# Patient Record
Sex: Female | Born: 1965 | Race: Black or African American | Hispanic: No | Marital: Single | Smoking: Never smoker
Health system: Southern US, Community
[De-identification: ages and names within clinical notes are randomized; demographics above are authoritative.]

## PROBLEM LIST (undated history)

## (undated) DIAGNOSIS — I10 Essential (primary) hypertension: Secondary | ICD-10-CM

---

## 2004-04-16 ENCOUNTER — Ambulatory Visit (HOSPITAL_COMMUNITY): Admission: AD | Admit: 2004-04-16 | Discharge: 2004-04-16 | Payer: Self-pay | Admitting: Obstetrics and Gynecology

## 2004-04-19 ENCOUNTER — Ambulatory Visit (HOSPITAL_COMMUNITY): Admission: AD | Admit: 2004-04-19 | Discharge: 2004-04-19 | Payer: Self-pay | Admitting: Obstetrics and Gynecology

## 2004-04-23 ENCOUNTER — Ambulatory Visit (HOSPITAL_COMMUNITY): Admission: AD | Admit: 2004-04-23 | Discharge: 2004-04-23 | Payer: Self-pay | Admitting: Obstetrics and Gynecology

## 2004-04-26 ENCOUNTER — Ambulatory Visit (HOSPITAL_COMMUNITY): Admission: AD | Admit: 2004-04-26 | Discharge: 2004-04-26 | Payer: Self-pay | Admitting: Obstetrics and Gynecology

## 2004-04-27 ENCOUNTER — Ambulatory Visit (HOSPITAL_COMMUNITY): Admission: RE | Admit: 2004-04-27 | Discharge: 2004-04-27 | Payer: Self-pay | Admitting: Obstetrics & Gynecology

## 2004-04-28 ENCOUNTER — Inpatient Hospital Stay (HOSPITAL_COMMUNITY): Admission: RE | Admit: 2004-04-28 | Discharge: 2004-05-02 | Payer: Self-pay | Admitting: *Deleted

## 2012-04-30 ENCOUNTER — Emergency Department (HOSPITAL_COMMUNITY)
Admission: EM | Admit: 2012-04-30 | Discharge: 2012-04-30 | Disposition: A | Discharge status: Home / Self Care | Payer: Medicaid Other | Attending: Emergency Medicine | Admitting: Emergency Medicine

## 2012-04-30 ENCOUNTER — Encounter (HOSPITAL_COMMUNITY): Payer: Self-pay

## 2012-04-30 DIAGNOSIS — I10 Essential (primary) hypertension: Secondary | ICD-10-CM | POA: Insufficient documentation

## 2012-04-30 DIAGNOSIS — E119 Type 2 diabetes mellitus without complications: Secondary | ICD-10-CM | POA: Insufficient documentation

## 2012-04-30 DIAGNOSIS — K0889 Other specified disorders of teeth and supporting structures: Secondary | ICD-10-CM

## 2012-04-30 DIAGNOSIS — K029 Dental caries, unspecified: Secondary | ICD-10-CM | POA: Insufficient documentation

## 2012-04-30 HISTORY — DX: Essential (primary) hypertension: I10

## 2012-04-30 MED ORDER — HYDROCODONE-ACETAMINOPHEN 5-325 MG PO TABS: 1.0000 | ORAL_TABLET | Freq: Four times a day (QID) | ORAL | Status: AC | PRN

## 2012-04-30 MED ORDER — PENICILLIN V POTASSIUM 500 MG PO TABS: 500.0000 mg | ORAL_TABLET | Freq: Four times a day (QID) | ORAL | Status: AC

## 2012-04-30 NOTE — ED Provider Notes (Signed)
History     CSN: [id number]  Arrival date & time 04/30/12  0811   First MD Initiated Contact with Patient 04/30/12 0820      Chief Complaint  Patient presents with  . Dental Pain    (Consider location/radiation/quality/duration/timing/severity/associated sxs/prior treatment) HPI Comments: Pain ~ 2 weeks.  Plans to see a dentist in [place] "as soon as i get the money together".  Using ambesol and ibuprofen with temporary relief.  Patient is a 46 y.o. female presenting with tooth pain. The history is provided by the patient. No language interpreter was used.  Dental PainThe primary symptoms include mouth pain. Primary symptoms do not include dental injury, oral bleeding, oral lesions or fever. The symptoms are worsening. The symptoms are new. The symptoms occur constantly.  Additional symptoms include: dental sensitivity to temperature. Additional symptoms do not include: purulent gums, trismus, jaw pain, facial swelling and swollen glands. Medical issues do not include: immunosuppression.    Past Medical History  Diagnosis Date  . Diabetes mellitus   . Hypertension     Past Surgical History  Procedure Date  . Cesarean section     No family history on file.  History  Substance Use Topics  . Smoking status: Never Smoker   . Smokeless tobacco: Not on file  . Alcohol Use: No    OB History    Grav Para Term Preterm Abortions TAB SAB Ect Mult Living                  Review of Systems  Constitutional: Negative for fever.  HENT: Positive for dental problem. Negative for facial swelling.     Allergies  Review of patient's allergies indicates no known allergies.  Home Medications   Current Outpatient Rx  Name Route Sig Dispense Refill  . HYDROCODONE-ACETAMINOPHEN 5-325 MG PO TABS Oral Take 1 tablet by mouth every 6 (six) hours as needed for pain. 20 tablet 0  . PENICILLIN V POTASSIUM 500 MG PO TABS Oral Take 1 tablet (500 mg total) by mouth 4 (four) times  daily. 40 tablet 0    BP 155/94  Pulse 83  Temp(Src) 98.5 F (36.9 C) (Oral)  Resp 18  Ht 5\' 6"  (1.676 m)  Wt 317 lb (143.79 kg)  BMI 51.17 kg/m2  SpO2 97%  LMP 04/11/2012  Physical Exam  Nursing note and vitals reviewed. Constitutional: She is oriented to person, place, and time. She appears well-developed and well-nourished. No distress.  HENT:  Head: Normocephalic and atraumatic. No trismus in the jaw.  Mouth/Throat: Uvula is midline, oropharynx is clear and moist and mucous membranes are normal. Dental caries present. No dental abscesses or uvula swelling.    Eyes: EOM are normal.  Neck: Normal range of motion.  Cardiovascular: Normal rate, regular rhythm and normal heart sounds.   Pulmonary/Chest: Effort normal and breath sounds normal.  Abdominal: Soft. She exhibits no distension. There is no tenderness.  Musculoskeletal: Normal range of motion.  Neurological: She is alert and oriented to person, place, and time.  Skin: Skin is warm and dry.  Psychiatric: She has a normal mood and affect. Judgment normal.    ED Course  Procedures (including critical care time)  Labs Reviewed - No data to display No results found.   1. Pain, dental       MDM  No obvious abscess. rx-hydrocodone, 20 rx-pen VK 500 mg QID, 40 Ibuprofen up to 800 mg TID with food.  Worthy Rancher, PA 04/30/12 [phone]

## 2012-04-30 NOTE — ED Notes (Signed)
Pt c/o toothache x 2 weeks.   

## 2012-04-30 NOTE — Discharge Instructions (Signed)
Dental Pain A tooth ache may be caused by cavities (tooth decay). Cavities expose the nerve of the tooth to air and hot or cold temperatures. It may come from an infection or abscess (also called a boil or furuncle) around your tooth. It is also often caused by dental caries (tooth decay). This causes the pain you are having. DIAGNOSIS  Your caregiver can diagnose this problem by exam. TREATMENT   If caused by an infection, it may be treated with medications which kill germs (antibiotics) and pain medications as prescribed by your caregiver. Take medications as directed.   Only take over-the-counter or prescription medicines for pain, discomfort, or fever as directed by your caregiver.   Whether the tooth ache today is caused by infection or dental disease, you should see your dentist as soon as possible for further care.  SEEK MEDICAL CARE IF: The exam and treatment you received today has been provided on an emergency basis only. This is not a substitute for complete medical or dental care. If your problem worsens or new problems (symptoms) appear, and you are unable to meet with your dentist, call or return to this location. SEEK IMMEDIATE MEDICAL CARE IF:   You have a fever.   You develop redness and swelling of your face, jaw, or neck.   You are unable to open your mouth.   You have severe pain uncontrolled by pain medicine.  MAKE SURE YOU:   Understand these instructions.   Will watch your condition.   Will get help right away if you are not doing well or get worse.  Document Released: 11/11/2005 Document Revised: 10/31/2011 Document Reviewed: 06/29/2008 [employer] Patient Information 2012 [employer].   Take the meds as directed.  Take ibuprofen 800 mg every 8 hrs with food.  Follow up with the dentist of your choice 

## 2012-04-30 NOTE — ED Provider Notes (Signed)
Medical screening examination/treatment/procedure(s) were performed by non-physician practitioner and as supervising physician I was immediately available for consultation/collaboration.  Yorley Buch, MD 04/30/12 1533 

## 2013-05-05 ENCOUNTER — Encounter (HOSPITAL_COMMUNITY): Payer: Self-pay | Admitting: Emergency Medicine

## 2013-05-05 ENCOUNTER — Emergency Department (HOSPITAL_COMMUNITY)
Admission: EM | Admit: 2013-05-05 | Discharge: 2013-05-05 | Disposition: A | Discharge status: Home / Self Care | Payer: Self-pay | Attending: Emergency Medicine | Admitting: Emergency Medicine

## 2013-05-05 DIAGNOSIS — J3489 Other specified disorders of nose and nasal sinuses: Secondary | ICD-10-CM | POA: Insufficient documentation

## 2013-05-05 DIAGNOSIS — I10 Essential (primary) hypertension: Secondary | ICD-10-CM | POA: Insufficient documentation

## 2013-05-05 DIAGNOSIS — E119 Type 2 diabetes mellitus without complications: Secondary | ICD-10-CM | POA: Insufficient documentation

## 2013-05-05 DIAGNOSIS — J329 Chronic sinusitis, unspecified: Secondary | ICD-10-CM | POA: Insufficient documentation

## 2013-05-05 MED ORDER — PREDNISONE 20 MG PO TABS
40.0000 mg | ORAL_TABLET | Freq: Once | ORAL | Status: AC
  Administered 2013-05-05: 40 mg via ORAL
  Filled 2013-05-05: qty 2

## 2013-05-05 MED ORDER — PREDNISONE 10 MG PO TABS: ORAL_TABLET | ORAL | Status: DC

## 2013-05-05 MED ORDER — HYDROCOD POLST-CHLORPHEN POLST 10-8 MG/5ML PO LQCR: 5.0000 mL | Freq: Two times a day (BID) | ORAL | Status: DC | PRN

## 2013-05-05 NOTE — ED Notes (Signed)
Pt c/o productive cough/congestion x 2 weeks. Pt states she has taken antibiotics and tessalon pearls with no relief. Nad noted.

## 2013-05-05 NOTE — ED Provider Notes (Signed)
History     CSN: [id number]  Arrival date & time 05/05/13  0803   First MD Initiated Contact with Patient 05/05/13 [phone]      Chief Complaint  Patient presents with  . Cough  . Nasal Congestion    (Consider location/radiation/quality/duration/timing/severity/associated sxs/prior treatment) HPI Comments: Kristen Briggs is a 47 y.o. female who presents to the Emergency Department complaining of nasal congestion and  Cough for 2 weeks.  States the cough is occasionally productive but mostly clear to white sputum.  She reports nasal congestion and sinus pressure. She denies shortness of breath, wheezing, fever, chest pain or post tussive emesis. She states that she was seen by her primary doctor at the onset of her symptoms  and given Tessalon Perles and a Z-Pak which did nothing to improve her symptoms. She states that she has an appointment with her primary care physician in [place] in one week.  Patient is a 47 y.o. female presenting with cough.  Cough Associated symptoms: rhinorrhea   Associated symptoms: no chest pain, no chills, no fever, no headaches, no shortness of breath, no sore throat and no wheezing     Past Medical History  Diagnosis Date  . Diabetes mellitus   . Hypertension     Past Surgical History  Procedure Laterality Date  . Cesarean section      No family history on file.  History  Substance Use Topics  . Smoking status: Never Smoker   . Smokeless tobacco: Not on file  . Alcohol Use: No    OB History   Grav Para Term Preterm Abortions TAB SAB Ect Mult Living                  Review of Systems  Constitutional: Negative for fever, chills, activity change and appetite change.  HENT: Positive for congestion, rhinorrhea and sinus pressure. Negative for sore throat and trouble swallowing.   Respiratory: Positive for cough. Negative for chest tightness, shortness of breath and wheezing.   Cardiovascular: Negative for chest pain.  Gastrointestinal:  Negative for nausea, vomiting and abdominal pain.  Neurological: Negative for dizziness, speech difficulty, weakness, numbness and headaches.  Hematological: Negative for adenopathy.  All other systems reviewed and are negative.    Allergies  Review of patient's allergies indicates no known allergies.  Home Medications  No current outpatient prescriptions on file.  BP 142/70  Pulse 75  Temp(Src) 98.5 F (36.9 C) (Oral)  Resp 18  Ht 5\' 5"  (1.651 m)  Wt 315 lb (142.883 kg)  BMI 52.42 kg/m2  SpO2 100%  LMP 04/19/2013  Physical Exam  Nursing note and vitals reviewed. Constitutional: She is oriented to person, place, and time. She appears well-developed and well-nourished. No distress.  HENT:  Head: Normocephalic and atraumatic.  Right Ear: Tympanic membrane and ear canal normal.  Left Ear: Tympanic membrane and ear canal normal.  Nose: Mucosal edema and rhinorrhea present. Right sinus exhibits maxillary sinus tenderness and frontal sinus tenderness. Left sinus exhibits maxillary sinus tenderness and frontal sinus tenderness.  Mouth/Throat: Uvula is midline, oropharynx is clear and moist and mucous membranes are normal. No oropharyngeal exudate.  Eyes: EOM are normal. Pupils are equal, round, and reactive to light.  Neck: Normal range of motion. Neck supple.  Cardiovascular: Normal rate, regular rhythm, normal heart sounds and intact distal pulses.   No murmur heard. Pulmonary/Chest: Effort normal. No respiratory distress. She has no wheezes. She has no rales. She exhibits no tenderness.  Lungs are  clear to auscultation bilaterally no wheezing, rales or stridor.  Musculoskeletal: She exhibits no edema.  Lymphadenopathy:    She has no cervical adenopathy.  Neurological: She is alert and oriented to person, place, and time. She exhibits normal muscle tone. Coordination normal.  Skin: Skin is warm and dry.    ED Course  Procedures (including critical care time)  Labs Reviewed  - No data to display No results found.      MDM    Patient is nontoxic appearing. Vital signs are stable. No hypoxia tachycardia or tachypnea. Cough with nasal congestion and sinus pressure for 2 weeks. No improvement with antibiotics. Symptoms are likely viral. Will try a prednisone taper. Patient understands that since she is a diabetic have advised her to discontinue the prednisone if her sugars become elevated. Patient agrees to the care plan, has an appointment for a followup visit with her primary doctor in one week. She appears stable for discharge.       Warrick Llera L. Trisha Mangle, PA-C 05/05/13 [phone]

## 2013-05-05 NOTE — ED Provider Notes (Signed)
Medical screening examination/treatment/procedure(s) were performed by non-physician practitioner and as supervising physician I was immediately available for consultation/collaboration.   Contessa Preuss L Francelia Mclaren, MD 05/05/13 1425 

## 2013-08-09 ENCOUNTER — Emergency Department (HOSPITAL_COMMUNITY)
Admission: EM | Admit: 2013-08-09 | Discharge: 2013-08-09 | Disposition: A | Discharge status: Home / Self Care | Payer: Self-pay | Attending: Emergency Medicine | Admitting: Emergency Medicine

## 2013-08-09 ENCOUNTER — Encounter (HOSPITAL_COMMUNITY): Payer: Self-pay

## 2013-08-09 ENCOUNTER — Emergency Department (HOSPITAL_COMMUNITY): Payer: Self-pay

## 2013-08-09 DIAGNOSIS — E119 Type 2 diabetes mellitus without complications: Secondary | ICD-10-CM | POA: Insufficient documentation

## 2013-08-09 DIAGNOSIS — I10 Essential (primary) hypertension: Secondary | ICD-10-CM | POA: Insufficient documentation

## 2013-08-09 DIAGNOSIS — Y939 Activity, unspecified: Secondary | ICD-10-CM | POA: Insufficient documentation

## 2013-08-09 DIAGNOSIS — X58XXXA Exposure to other specified factors, initial encounter: Secondary | ICD-10-CM | POA: Insufficient documentation

## 2013-08-09 DIAGNOSIS — IMO0002 Reserved for concepts with insufficient information to code with codable children: Secondary | ICD-10-CM | POA: Insufficient documentation

## 2013-08-09 DIAGNOSIS — Y929 Unspecified place or not applicable: Secondary | ICD-10-CM | POA: Insufficient documentation

## 2013-08-09 DIAGNOSIS — S56912A Strain of unspecified muscles, fascia and tendons at forearm level, left arm, initial encounter: Secondary | ICD-10-CM

## 2013-08-09 MED ORDER — HYDROCODONE-ACETAMINOPHEN 5-325 MG PO TABS
1.0000 | ORAL_TABLET | Freq: Once | ORAL | Status: AC
  Administered 2013-08-09: 1 via ORAL
  Filled 2013-08-09: qty 1

## 2013-08-09 MED ORDER — IBUPROFEN 800 MG PO TABS: 800.0000 mg | ORAL_TABLET | Freq: Three times a day (TID) | ORAL | Status: DC

## 2013-08-09 NOTE — ED Notes (Signed)
Pt alert & oriented x4, stable gait. Patient given discharge instructions, paperwork & prescription(s). Patient  instructed to stop at the registration desk to finish any additional paperwork. Patient verbalized understanding. Pt left department w/ no further questions. 

## 2013-08-09 NOTE — ED Notes (Signed)
Pt c/o pain to left arm x 2 or 3 days.  Pt says unable to straighten her arm out due to pain.  Pt says most of the pain is in the [facility] area but feels a little bit of pain in left shoulder.  Denies injury.  Reports pain is worse with movement.  Radial pulse present, pt can move fingers.

## 2013-08-09 NOTE — ED Provider Notes (Signed)
47 year old female with a history of left forearm pain. This has been present for approximately 4 days, persistent, it is difficult to straighten the arm and has difficulty with pronation and supination. She has no redness, no significant swelling and no fevers. This area is tender to palpation on my exam, she is able to extend her arm to near straight but has significant tenderness with that. There is also mild tenderness over the site of the proximal volar and radial forearm, no redness, no swelling, no changes in the skin, no streaking, no lymphadenopathy of the epitrochlear area, normal range of motion of the wrist and shoulder on the left. This does not appear to be a necrotizing fasciitis on exam, she is very well-appearing, I suspect that she has some tendinitis in the forearm, she does type at work. She will be instructed to use ice packs, elevation, anti-inflammatories and followup. Return precautions given for possible early infection though I doubt this at this time. Her joint is supple and there is no tenderness over the left elbow joint laterally. There is no other joint arthropathies.  Medical screening examination/treatment/procedure(s) were conducted as a shared visit with non-physician practitioner(s) and myself.  I personally evaluated the patient during the encounter.    Vida Roller, MD 08/09/13 [phone]

## 2013-08-09 NOTE — ED Provider Notes (Signed)
CSN: [id number]     Arrival date & time 08/09/13  0809 History   First MD Initiated Contact with Patient 08/09/13 0820     Chief Complaint  Patient presents with  . Arm Pain   (Consider location/radiation/quality/duration/timing/severity/associated sxs/prior Treatment) HPI Comments: Kristen Briggs is a 47 y.o. female who presents to the Emergency Department complaining of left forearm and elbow pain for 4 days. She states the pain is worse with rotation of her elbow and extension of her left arm. Pain improves when the arm is held near her side and slightly flexed. 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 She states the pain improves with rest and anti-inflammatories. She denies neck pain, chest pain, shortness of breath, shoulder pain, tingling to her fingertips or numbness. She does report having an IV in her left forearm approximately 3 weeks ago but denies any complications or redness or swelling to the arm.  Patient is a 47 y.o. female presenting with arm pain. The history is provided by the patient.  Arm Pain This is a new problem. The current episode started in the past 7 days. The problem occurs constantly. The problem has been unchanged. Associated symptoms include arthralgias and myalgias. Pertinent negatives include no chest pain, chills, congestion, diaphoresis, fever, headaches, joint swelling, nausea, neck pain, numbness, rash, sore throat, visual change, vomiting or weakness. The symptoms are aggravated by twisting (movement, extension). She has tried ice and NSAIDs for the symptoms. The treatment provided moderate relief.    Past Medical History  Diagnosis Date  . Diabetes mellitus   . Hypertension    Past Surgical History  Procedure Laterality Date  . Cesarean section     No family history on file. History  Substance Use Topics  . Smoking status: Never Smoker   . Smokeless tobacco: Not on file  . Alcohol Use: No   OB History   Grav Para Term Preterm Abortions TAB SAB Ect Mult Living                 Review of Systems  Constitutional: Negative for fever, chills and diaphoresis.  HENT: Negative for congestion, sore throat and neck pain.   Respiratory: Negative for chest tightness and shortness of breath.   Cardiovascular: Negative for chest pain.  Gastrointestinal: Negative for nausea and vomiting.  Genitourinary: Negative for dysuria and difficulty urinating.  Musculoskeletal: Positive for myalgias and arthralgias. Negative for joint swelling.  Skin: Negative for color change, rash and wound.  Neurological: Negative for dizziness, weakness, numbness and headaches.  All other systems reviewed and are negative.    Allergies  Review of patient's allergies indicates no known allergies.  Home Medications   Current Outpatient Rx  Name  Route  Sig  Dispense  Refill  . chlorpheniramine-HYDROcodone (TUSSIONEX PENNKINETIC ER) 10-8 MG/5ML LQCR   Oral   Take 5 mLs by mouth every 12 (twelve) hours as needed.   140 mL   0   . predniSONE (DELTASONE) 10 MG tablet      Take 6 tablets day one, 5 tablets day two, 4 tablets day three, 3 tablets day four, 2 tablets day five, then 1 tablet day six   21 tablet   0    BP 145/81  Pulse 64  Temp(Src) 98 F (36.7 C) (Oral)  Resp 18  Ht 5\' 6"  (1.676 m)  Wt 320 lb (145.151 kg)  BMI 51.67 kg/m2  SpO2 100%  LMP 07/24/2013 Physical  Exam  Nursing note and vitals reviewed. Constitutional: She is oriented to person, place, and time. She appears well-developed and well-nourished. No distress.  HENT:  Head: Normocephalic and atraumatic.  Neck: Normal range of motion. Neck supple. No thyromegaly present.  Cardiovascular: Normal rate, regular rhythm, normal heart sounds and intact distal pulses.   No murmur heard. Pulmonary/Chest: Effort normal and breath sounds normal. No respiratory distress. She exhibits no tenderness.  Musculoskeletal: She exhibits tenderness. She  exhibits no edema.   Ttp left proximal forearm and lateral elbow.   pain reproduced with extension of the arm and supination.  Radial pulse is brisk, distal sensation intact, CR< 2 sec. Grip strength is strong and symmetrical.   No erythema, edema , or bony deformity of the elbow joint. Site of previous IV is soft, NT, no erythema or excessive warmth. No tenderness of the vein.   Lymphadenopathy:    She has no cervical adenopathy.  Neurological: She is alert and oriented to person, place, and time. She has normal strength. No sensory deficit. She exhibits normal muscle tone. Coordination normal.  Skin: Skin is warm and dry.    ED Course  Procedures (including critical care time) Labs Review Labs Reviewed - No data to display Imaging Review No results found.  MDM    Patient has tenderness to the left proximal forearm muscle.  No tenderness of the elbow joint.  No concerning sx's for septic joint.  NV intact.    Patient also seen by EDP and care plan discussed.  Sx's likely related to muscular strain/tendontiits.    Pt agrees to ice, Ibuprofen for pain and to return here for any worsening symptoms or signs of infection.  Referral for Dr. Hilda Lias also given.  VSS.  Pt agrees to care plan and verbalized understanding for f/u  Coltan Spinello L. Trisha Mangle, PA-C 08/09/13 2118

## 2013-08-10 NOTE — ED Provider Notes (Signed)
Medical screening examination/treatment/procedure(s) were conducted as a shared visit with non-physician practitioner(s) and myself.  I personally evaluated the patient during the encounter  Please see my separate respective documentation pertaining to this patient encounter   Vida Roller, MD 08/10/13 [phone]

## 2014-04-06 ENCOUNTER — Emergency Department (HOSPITAL_COMMUNITY)
Admission: EM | Admit: 2014-04-06 | Discharge: 2014-04-06 | Disposition: A | Discharge status: Home / Self Care | Payer: Self-pay | Attending: Emergency Medicine | Admitting: Emergency Medicine

## 2014-04-06 ENCOUNTER — Emergency Department (HOSPITAL_COMMUNITY): Payer: Self-pay

## 2014-04-06 ENCOUNTER — Encounter (HOSPITAL_COMMUNITY): Payer: Self-pay | Admitting: Emergency Medicine

## 2014-04-06 DIAGNOSIS — Z791 Long term (current) use of non-steroidal anti-inflammatories (NSAID): Secondary | ICD-10-CM | POA: Insufficient documentation

## 2014-04-06 DIAGNOSIS — I10 Essential (primary) hypertension: Secondary | ICD-10-CM | POA: Insufficient documentation

## 2014-04-06 DIAGNOSIS — R109 Unspecified abdominal pain: Secondary | ICD-10-CM

## 2014-04-06 DIAGNOSIS — K529 Noninfective gastroenteritis and colitis, unspecified: Secondary | ICD-10-CM

## 2014-04-06 DIAGNOSIS — Z3202 Encounter for pregnancy test, result negative: Secondary | ICD-10-CM | POA: Insufficient documentation

## 2014-04-06 DIAGNOSIS — E119 Type 2 diabetes mellitus without complications: Secondary | ICD-10-CM | POA: Insufficient documentation

## 2014-04-06 DIAGNOSIS — K5289 Other specified noninfective gastroenteritis and colitis: Secondary | ICD-10-CM | POA: Insufficient documentation

## 2014-04-06 LAB — COMPREHENSIVE METABOLIC PANEL
ALK PHOS: 141 U/L — AB (ref 39–117)
ALT: 12 U/L (ref 0–35)
AST: 14 U/L (ref 0–37)
Albumin: 3 g/dL — ABNORMAL LOW (ref 3.5–5.2)
BUN: 10 mg/dL (ref 6–23)
CHLORIDE: 96 meq/L (ref 96–112)
CO2: 28 meq/L (ref 19–32)
Calcium: 8.9 mg/dL (ref 8.4–10.5)
Creatinine, Ser: 0.78 mg/dL (ref 0.50–1.10)
Glucose, Bld: 423 mg/dL — ABNORMAL HIGH (ref 70–99)
Potassium: 3.8 mEq/L (ref 3.7–5.3)
SODIUM: 134 meq/L — AB (ref 137–147)
Total Bilirubin: 0.3 mg/dL (ref 0.3–1.2)
Total Protein: 7.5 g/dL (ref 6.0–8.3)

## 2014-04-06 LAB — URINALYSIS, ROUTINE W REFLEX MICROSCOPIC
BILIRUBIN URINE: NEGATIVE
GLUCOSE, UA: 250 mg/dL — AB
HGB URINE DIPSTICK: NEGATIVE
Ketones, ur: NEGATIVE mg/dL
Leukocytes, UA: NEGATIVE
Nitrite: NEGATIVE
PH: 6 (ref 5.0–8.0)
PROTEIN: NEGATIVE mg/dL
SPECIFIC GRAVITY, URINE: 1.01 (ref 1.005–1.030)
UROBILINOGEN UA: 0.2 mg/dL (ref 0.0–1.0)

## 2014-04-06 LAB — CBC WITH DIFFERENTIAL/PLATELET
BASOS PCT: 0 % (ref 0–1)
Basophils Absolute: 0.1 10*3/uL (ref 0.0–0.1)
EOS ABS: 0.2 10*3/uL (ref 0.0–0.7)
EOS PCT: 2 % (ref 0–5)
HCT: 38.3 % (ref 36.0–46.0)
Hemoglobin: 13.5 g/dL (ref 12.0–15.0)
LYMPHS ABS: 4.1 10*3/uL — AB (ref 0.7–4.0)
LYMPHS PCT: 28 % (ref 12–46)
MCH: 28.1 pg (ref 26.0–34.0)
MCHC: 35.2 g/dL (ref 30.0–36.0)
MCV: 79.6 fL (ref 78.0–100.0)
MONO ABS: 0.8 10*3/uL (ref 0.1–1.0)
MONOS PCT: 5 % (ref 3–12)
NEUTROS ABS: 9.5 10*3/uL — AB (ref 1.7–7.7)
NEUTROS PCT: 65 % (ref 43–77)
PLATELETS: 367 10*3/uL (ref 150–400)
RBC: 4.81 MIL/uL (ref 3.87–5.11)
RDW: 12.1 % (ref 11.5–15.5)
WBC: 14.6 10*3/uL — ABNORMAL HIGH (ref 4.0–10.5)

## 2014-04-06 LAB — LIPASE, BLOOD: Lipase: 39 U/L (ref 11–59)

## 2014-04-06 LAB — PREGNANCY, URINE: PREG TEST UR: NEGATIVE

## 2014-04-06 MED ORDER — CIPROFLOXACIN HCL 500 MG PO TABS: 500.0000 mg | ORAL_TABLET | Freq: Two times a day (BID) | ORAL | Status: DC

## 2014-04-06 MED ORDER — HYDROCODONE-ACETAMINOPHEN 5-325 MG PO TABS: ORAL_TABLET | ORAL | Status: DC

## 2014-04-06 MED ORDER — PROMETHAZINE HCL 25 MG PO TABS: 25.0000 mg | ORAL_TABLET | Freq: Four times a day (QID) | ORAL | Status: DC | PRN

## 2014-04-06 MED ORDER — METRONIDAZOLE 500 MG PO TABS: 500.0000 mg | ORAL_TABLET | Freq: Three times a day (TID) | ORAL | Status: DC

## 2014-04-06 MED ORDER — IOHEXOL 300 MG/ML  SOLN
50.0000 mL | Freq: Once | INTRAMUSCULAR | Status: AC | PRN
  Administered 2014-04-06: 50 mL via ORAL

## 2014-04-06 MED ORDER — IOHEXOL 300 MG/ML  SOLN
100.0000 mL | Freq: Once | INTRAMUSCULAR | Status: AC | PRN
  Administered 2014-04-06: 100 mL via INTRAVENOUS

## 2014-04-06 NOTE — ED Provider Notes (Signed)
CSN: [id number]     Arrival date & time 04/06/14  1134 History   First MD Initiated Contact with Patient 04/06/14 1155     Chief Complaint  Patient presents with  . Abdominal Pain     HPI Pt was seen at 1155.  Per pt, c/o gradual onset and persistence of constant generalized abd "pain" since yesterday.  Has been associated with nausea.  Describes the abd pain as "sore," with radiation into her left lower back. Denies vomiting/diarrhea, no fevers, no dysuria/hematuria, no vaginal bleeding/discharge, no rash, no CP/SOB, no black or blood in stools.       Past Medical History  Diagnosis Date  . Diabetes mellitus   . Hypertension    Past Surgical History  Procedure Laterality Date  . Cesarean section      History  Substance Use Topics  . Smoking status: Never Smoker   . Smokeless tobacco: Not on file  . Alcohol Use: No    Review of Systems ROS: Statement: All systems negative except as marked or noted in the HPI; Constitutional: Negative for fever and chills. ; ; Eyes: Negative for eye pain, redness and discharge. ; ; ENMT: Negative for ear pain, hoarseness, nasal congestion, sinus pressure and sore throat. ; ; Cardiovascular: Negative for chest pain, palpitations, diaphoresis, dyspnea and peripheral edema. ; ; Respiratory: Negative for cough, wheezing and stridor. ; ; Gastrointestinal: +nausea, abd pain. Negative for vomiting, diarrhea, blood in stool, hematemesis, jaundice and rectal bleeding. . ; ; Genitourinary: Negative for dysuria, flank pain and hematuria. ; ; GYN:  No vaginal bleeding, no vaginal discharge, no vulvar pain.;;  Musculoskeletal: +low back pain. Negative for neck pain. Negative for swelling and trauma.; ; Skin: Negative for pruritus, rash, abrasions, blisters, bruising and skin lesion.; ; Neuro: Negative for headache, lightheadedness and neck stiffness. Negative for weakness, altered level of consciousness , altered mental status, extremity weakness, paresthesias,  involuntary movement, seizure and syncope.        Allergies  Review of patient's allergies indicates no known allergies.  Home Medications   Prior to Admission medications   Medication Sig Start Date End Date Taking? Authorizing Provider  chlorpheniramine-HYDROcodone (TUSSIONEX PENNKINETIC ER) 10-8 MG/5ML LQCR Take 5 mLs by mouth every 12 (twelve) hours as needed. 05/05/13   Tammy L. Triplett, PA-C  ibuprofen (ADVIL,MOTRIN) 800 MG tablet Take 1 tablet (800 mg total) by mouth 3 (three) times daily. Take with food 08/09/13   Tammy L. Triplett, PA-C  predniSONE (DELTASONE) 10 MG tablet Take 6 tablets day one, 5 tablets day two, 4 tablets day three, 3 tablets day four, 2 tablets day five, then 1 tablet day six 05/05/13   Tammy L. Triplett, PA-C   BP 132/85  Pulse 81  Temp(Src) 98.7 F (37.1 C) (Oral)  Resp 16  Ht 5\' 5"  (1.651 m)  Wt 316 lb (143.337 kg)  BMI 52.59 kg/m2  SpO2 99%  LMP 03/30/2014 Physical Exam 1200: Physical examination:  Nursing notes reviewed; Vital signs and O2 SAT reviewed;  Constitutional: Well developed, Well nourished, Well hydrated, In no acute distress; Head:  Normocephalic, atraumatic; Eyes: EOMI, PERRL, No scleral icterus; ENMT: Mouth and pharynx normal, Mucous membranes moist; Neck: Supple, Full range of motion, No lymphadenopathy; Cardiovascular: Regular rate and rhythm, No murmur, rub, or gallop; Respiratory: Breath sounds clear & equal bilaterally, No rales, rhonchi, wheezes.  Speaking full sentences with ease, Normal respiratory effort/excursion; Chest: Nontender, Movement normal; Abdomen: Soft, +minimal diffuse tenderness to palp. No rebound or  guarding. Nondistended, Normal bowel sounds; Genitourinary: No CVA tenderness; Spine:  No midline CS, TS, LS tenderness. No rash.;; Extremities: Pulses normal, No tenderness, No edema, No calf edema or asymmetry.; Neuro: AA&Ox3, Major CN grossly intact.  Speech clear. No gross focal motor or sensory deficits in  extremities. Climbs on and off stretcher easily by herself. Gait steady.; Skin: Color normal, Warm, Dry.    ED Course  Procedures     EKG Interpretation None      MDM  MDM Reviewed: previous chart, nursing note and vitals Reviewed previous: labs Interpretation: labs and CT scan    Results for orders placed during the hospital encounter of 04/06/14  CBC WITH DIFFERENTIAL      Result Value Ref Range   WBC 14.6 (*) 4.0 - 10.5 K/uL   RBC 4.81  3.87 - 5.11 MIL/uL   Hemoglobin 13.5  12.0 - 15.0 g/dL   HCT 16.1  09.6 - 04.5 %   MCV 79.6  78.0 - 100.0 fL   MCH 28.1  26.0 - 34.0 pg   MCHC 35.2  30.0 - 36.0 g/dL   RDW 40.9  81.1 - 91.4 %   Platelets 367  150 - 400 K/uL   Neutrophils Relative % 65  43 - 77 %   Neutro Abs 9.5 (*) 1.7 - 7.7 K/uL   Lymphocytes Relative 28  12 - 46 %   Lymphs Abs 4.1 (*) 0.7 - 4.0 K/uL   Monocytes Relative 5  3 - 12 %   Monocytes Absolute 0.8  0.1 - 1.0 K/uL   Eosinophils Relative 2  0 - 5 %   Eosinophils Absolute 0.2  0.0 - 0.7 K/uL   Basophils Relative 0  0 - 1 %   Basophils Absolute 0.1  0.0 - 0.1 K/uL  COMPREHENSIVE METABOLIC PANEL      Result Value Ref Range   Sodium 134 (*) 137 - 147 mEq/L   Potassium 3.8  3.7 - 5.3 mEq/L   Chloride 96  96 - 112 mEq/L   CO2 28  19 - 32 mEq/L   Glucose, Bld 423 (*) 70 - 99 mg/dL   BUN 10  6 - 23 mg/dL   Creatinine, Ser 7.82  0.50 - 1.10 mg/dL   Calcium 8.9  8.4 - 95.6 mg/dL   Total Protein 7.5  6.0 - 8.3 g/dL   Albumin 3.0 (*) 3.5 - 5.2 g/dL   AST 14  0 - 37 U/L   ALT 12  0 - 35 U/L   Alkaline Phosphatase 141 (*) 39 - 117 U/L   Total Bilirubin 0.3  0.3 - 1.2 mg/dL   GFR calc non Af Amer >90  >90 mL/min   GFR calc Af Amer >90  >90 mL/min  LIPASE, BLOOD      Result Value Ref Range   Lipase 39  11 - 59 U/L  URINALYSIS, ROUTINE W REFLEX MICROSCOPIC      Result Value Ref Range   Color, Urine YELLOW  YELLOW   APPearance CLEAR  CLEAR   Specific Gravity, Urine 1.010  1.005 - 1.030   pH 6.0  5.0 -  8.0   Glucose, UA 250 (*) NEGATIVE mg/dL   Hgb urine dipstick NEGATIVE  NEGATIVE   Bilirubin Urine NEGATIVE  NEGATIVE   Ketones, ur NEGATIVE  NEGATIVE mg/dL   Protein, ur NEGATIVE  NEGATIVE mg/dL   Urobilinogen, UA 0.2  0.0 - 1.0 mg/dL   Nitrite NEGATIVE  NEGATIVE  Leukocytes, UA NEGATIVE  NEGATIVE  PREGNANCY, URINE      Result Value Ref Range   Preg Test, Ur NEGATIVE  NEGATIVE   Ct Abdomen Pelvis W Contrast 04/06/2014   CLINICAL DATA:  Abdominal pain, nausea  EXAM: CT ABDOMEN AND PELVIS WITH CONTRAST  TECHNIQUE: Multidetector CT imaging of the abdomen and pelvis was performed using the standard protocol following bolus administration of intravenous contrast.  CONTRAST:  50mL OMNIPAQUE IOHEXOL 300 MG/ML SOLN, 100mL OMNIPAQUE IOHEXOL 300 MG/ML SOLN  COMPARISON:  None.  FINDINGS: Lung bases are unremarkable. Sagittal images of the spine shows mild degenerative changes lower thoracic spine.  There is fatty infiltration of the liver. The gallbladder is contracted without evidence of calcified gallstones. No pericholecystic fluid.  The pancreas, spleen and right adrenal is unremarkable. There is a low density nodule left adrenal gland measures 1.9 cm and 22 Hounsfield units in attenuation. This probable represents another enema.  Abdominal aorta is unremarkable. No small bowel obstruction. No ascites or free air. No adenopathy.  There is no pericecal inflammation. The terminal ileum is unremarkable. The appendix is not clearly identified.  There is nonspecific minimal thickening of colonic wall in hepatic flexure of the colon axial image 40 and sagittal image 49. Clinical correlation is necessary to exclude minimal colitis. There is no surrounding mesenteric stranding or fluid.  No colonic obstruction.  There is some fluid noted within endocervical canal of the uterus. No adnexal masses noted. No inguinal adenopathy. No destructive bony lesions are noted within pelvis. The urinary bladder is unremarkable.   Kidneys are symmetrical in size and enhancement. No hydronephrosis or hydroureter.  Delayed renal images shows bilateral renal symmetrical excretion. Bilateral visualized proximal ureter is unremarkable.  IMPRESSION: 1. There is fatty infiltration of the liver.  No focal hepatic mass. 2. Contracted gallbladder without evidence of calcified gallstones. 3. Nonspecific minimal thickening of colonic wall in hepatic flexure of the colon. Minimal short segment colitis cannot be excluded. Clinical correlation is necessary. There is no surrounding fluid or inflammation. 4. No pericecal inflammation. The appendix is not clearly identified. 5. Small amount of fluid in endocervical canal of the uterus. No adnexal mass.   Electronically Signed   By: Natasha MeadLiviu  Pop M.D.   On: 04/06/2014 13:18    1405:  Pt has tol PO well while in the ED without N/V.  No stooling while in the ED.  Abd benign, VSS. Pt wants to go home now. Will tx for possible colitis. Dx and testing d/w pt.  Questions answered.  Verb understanding, agreeable to d/c home with outpt f/u.    Laray AngerKathleen M Panagiotis Oelkers, DO 04/08/14 2139

## 2014-04-06 NOTE — ED Notes (Signed)
Pt states generalized abdominal pain which began last night. Also states "a little nausea, I guess"

## 2014-04-06 NOTE — Discharge Instructions (Signed)
°Emergency Department Resource Guide °1) Find a Doctor and Pay Out of Pocket °Although you won't have to find out who is covered by your insurance plan, it is a good idea to ask around and get recommendations. You will then need to call the office and see if the doctor you have chosen will accept you as a new patient and what types of options they offer for patients who are self-pay. Some doctors offer discounts or will set up payment plans for their patients who do not have insurance, but you will need to ask so you aren't surprised when you get to your appointment. ° °2) Contact Your Local Health Department °Not all health departments have doctors that can see patients for sick visits, but many do, so it is worth a call to see if yours does. If you don't know where your local health department is, you can check in your phone book. The CDC also has a tool to help you locate your state's health department, and many state websites also have listings of all of their local health departments. ° °3) Find a Walk-in Clinic °If your illness is not likely to be very severe or complicated, you may want to try a walk in clinic. These are popping up all over the country in pharmacies, drugstores, and shopping centers. They're usually staffed by nurse practitioners or physician assistants that have been trained to treat common illnesses and complaints. They're usually fairly quick and inexpensive. However, if you have serious medical issues or chronic medical problems, these are probably not your best option. ° °No Primary Care Doctor: °- Call [facility] at  832-8000 - they can help you locate a primary care doctor that  accepts your insurance, provides certain services, etc. °- Physician Referral Service- 1-800-533-3463 ° °Chronic Pain Problems: °Organization         Address  Phone   Notes  °[facility]  (336) [phone] Patients need to be referred by their primary care doctor.  ° °Medication  Assistance: °Organization         Address  Phone   Notes  °[place] [facility] [address] °[place], [place] [address] (336) 641-8030 --Must be a resident of [place] °-- Must have NO insurance coverage whatsoever (no Medicaid/ Medicare, etc.) °-- The pt. MUST have a primary care doctor that directs their care regularly and follows them in the community °  °[facility]  (866) 331-1348   °[employer]  (888) 892-1162   ° °Agencies that provide inexpensive medical care: °Organization         Address  Phone   Notes  °[facility]  (336) 832-8035   ° Internal Medicine    (336) 832-7272   °[facility] [address] °[place], [place] [address] (336) 832-4777   °[facility] [address], °[place] (336) 271-4999   °[facility]    (336) 373-0678   °[facility]    (336) 272-1050   °[facility] ° [address], [place] Phone:  (336) 832-4444, Fax:  (336) 832-4440 Hours of Operation:  9 am - 6 pm, M-F.  Also accepts Medicaid/Medicare and self-pay.  °[facility] ° [address],  Phone: (336) 832-3150, Fax: (336) [phone]. Hours of Operation:  8:30 am - 5:30 pm, M-F.  Also accepts Medicaid and self-pay.  °[facility] [place] [address], [place] Phone: (336) 878-6027   °[facility] [address], [place], [place] (336)723-1848, Ext. 123 Mondays & Thursdays: 7-9 AM.  First 15 patients are seen on a first come, first serve basis. °  ° °Medicaid-accepting [place] Providers: ° °Organization         Address  Phone   Notes  °[facility] [address], [place] (336) 641-2100 Also accepts self-pay patients.  °[facility] [address], [place] ° (336) 856-9996   °[facility] [address], [place]  (336) 288-8857   °[facility] [address] [place] [address], [place] (336) 299-7000   °Veita Bland [address], [place]  ° (336) 373-1557 Only accepts [place] Access Medicaid patients after they have their name applied to their card.  ° °Self-Pay (no insurance) in [place]: ° °Organization         Address  Phone   Notes  °Sickle Cell Patients, [facility] [address], [place] (336) 832-1970   °[facility] [address], [place] (336) 832-4400   °[facility] ° [address], [place] (336) 992-4800   °[facility]/Dr. Osei-Bonsu ° [address] [place] [address], [place] or [address], [place] (336) [phone] Phone number for both [place] and [place] locations is the same.  °[facility] [address], [place] (336) 299-0000   °[facility] [address] [place] [address], [place] or [address] (336) 852-7530 °(336) 878-2260   °[facility] [address], [place] (336) 350-1642, phone; (336) 294-5005, fax Sees patients 1st and 3rd Saturday of every month.  Must not qualify for public or private insurance (i.e. Medicaid, Medicare, [place] Health Choice, Veterans' Benefits) • Household income should be no more than 200% of the poverty level •The clinic cannot treat you if you are pregnant or think you are pregnant • Sexually transmitted diseases are not treated at the clinic.  ° ° °Dental Care: °Organization         Address  Phone  Notes  °[place] [facility] [address], [place] (336) 641-6152 Accepts children up to age 21 who are enrolled in Medicaid or [place] Health Choice; pregnant women with a Medicaid card; and children who have applied for Medicaid or [place] Health Choice, but were declined, whose parents can pay a reduced fee at time of service.  °[place]  [facility] [place]  [address], [place] (336) 641-7733 Accepts children up to age 21 who are enrolled in Medicaid or [place] Health Choice; pregnant women with a Medicaid card; and children who have applied for Medicaid or  Health Choice, but were declined, whose parents can pay a reduced fee at time of service.  °[facility] ° [address], [place] (336) 641-4533 Patients are seen by appointment only. Walk-ins are not accepted. [facility] will see patients 18 years of age and older. °Monday - Tuesday (8am-5pm) °Most Wednesdays (8:30-5pm) °$30 per visit, cash only  °[facility] ° [address], [place] (336) 641-4533 Patients are seen by appointment only. Walk-ins are not accepted. [facility] will see patients 18 years of age and older. °One   Wednesday Evening (Monthly: Volunteer Based).  $30 per visit, cash only  °[facility]  (919) 537-3737 for adults; Children under age 4, call Graduate Pediatric Dentistry at (919) [phone]. Children aged 4-14, please call (919) [phone] to request a pediatric application. ° Dental services are provided in all areas of dental care including fillings, crowns and bridges, complete and partial dentures, implants, gum treatment, root canals, and extractions. Preventive care is also provided. Treatment is provided to both adults and children. °Patients are selected via a lottery and there is often a waiting list. °  °[facility] [address], °[place] ° (336) 763-8833 [email or web address] °  °[facility] [address], [place], [place] (336)723-1848, Ext. 123 Second and Fourth Thursday of each month, opens at 6:30 AM; Clinic ends at 9 AM.  Patients are seen on a first-come first-served basis, and a limited number are seen during each clinic.  ° °[facility] ° [address], [place], [place] (336) 723-7904    Eligibility Requirements °You must have lived in [place], [place], or [place] counties for at least the last three months. °  You cannot be eligible for state or federal sponsored healthcare insurance, including Veterans Administration, Medicaid, or Medicare. °  You generally cannot be eligible for healthcare insurance through your employer.  °  How to apply: °Eligibility screenings are held every Tuesday and Wednesday afternoon from 1:00 pm until 4:00 pm. You do not need an appointment for the interview!  °[facility] [address], [place], [place] 336-631-2330   °[facility]  336-342-8273   °[facility]  336-703-3100   °[facility]  336-570-6415   ° °Behavioral Health Resources in the Community: °Intensive Outpatient Programs °Organization         Address  Phone  Notes  °[facility] [address], [place], [place] 336-878-6098   °[facility] [address], [place], [place] 336-832-9800   °[facility] [address], [place], [place] ° 336-882-2125   °[facility] [address],  °[place], [place] 1-800-853-5163 or 336-641-4981   °Substance Abuse Resources °Organization         Address  Phone  Notes  °[facility]  336-882-2125   °[facility]  336-784-9470   °[facility]  336-285-9073   °[facility]  336-845-3988   °Residential & Outpatient Substance Abuse Program  1-800-659-3381   °Psychological Services °Organization         Address  Phone  Notes  °[facility]  336- 832-9600   °[facility]  336- 378-7881   °[facility] [address], [place] 1-800-853-5163 or 336-641-4981   ° °Mobile Crisis Teams °Organization         Address  Phone  Notes  °[facility], Mobile Crisis Care Unit  1-877-626-1772   °Assertive °[facility] ° [address]  [place], [place] 336-834-9664   °Sharon DeEsch [address] °[place] 336-554-5454   ° °Self-Help/Support Groups °Organization         Address  Phone             Notes  °[facility] - variety of support groups  336- 373-1402 Call for more information  °Narcotics Anonymous (NA), [facility] [address], °[place]   2 meetings at this location  ° °  Residential Treatment Programs °Organization         Address  Phone  Notes  °[facility] [address],    °[place]  1-866-801-8205   °[facility] ° [address], [place], [place] 704-293-8524   °[facility] [address], [place] 336-845-3988 Admissions: 8am-3pm M-F  °[facility] [address]    °[place], [place] 336-841-1104   °[facility] [address], [place], [place] 336-379-7146   °[facility] [address]  °[place], [place] 336-285-9073   °[facility] - Intensive Outpatient [address], [place], [place] 336-852-3033   °[facility] [address]  °[place], [place] 1-877-615-2722 or 336-784-9470   °[facility] [address] [place], [place] 336-227-7417 Accepts Medicaid  °[facility] [address]  °[place] 1-800-659-3381 Substance Abuse/Addiction Treatment  ° °[place] Behavioral Health Resources °Organization         Address  Phone  Notes  °[facility]  (888) 581-9988   °Julie Brannon, PhD [address] [place], [place]   (336) 349-5553 or (336) 951-0000   °[facility]   [address] °[place], [place] (336) 349-4454   °[facility] [address], [place], [place] (336) 342-8316 Insurance/Medicaid/sponsorship through [facility]  °[facility] [address]                                    [place], [place] (336) 342-8316 Therapy/tele-psych/case    °[facility] [address]  ° [place],  (336) 349-2233    °Dr. Arfeen  (336) 349-4544   °[facility] [place]  [employer] [place] [facility] 1) [address], [place] °2) [address], [place] °3)  [address], [place] (336) 349-3220 °(336) 342-7768 ° °(336) 342-8140   °[place] Child Abuse [facility] (336) 342-1394 or (336) 342-3537 (After Hours)    ° ° °Take the prescriptions as directed.  Call your regular medical doctor today to schedule a follow up appointment within the next 2 days.  Return to the Emergency Department immediately sooner if worsening.  ° °

## 2014-04-06 NOTE — ED Notes (Signed)
Pt received discharge instructions and prescriptions, verbalized understanding and has no further questions. Pt ambulated to exit in stable condition.   

## 2014-05-16 ENCOUNTER — Emergency Department (HOSPITAL_COMMUNITY): Payer: Self-pay

## 2014-05-16 ENCOUNTER — Encounter (HOSPITAL_COMMUNITY): Payer: Self-pay | Admitting: Emergency Medicine

## 2014-05-16 ENCOUNTER — Emergency Department (HOSPITAL_COMMUNITY)
Admission: EM | Admit: 2014-05-16 | Discharge: 2014-05-16 | Disposition: A | Discharge status: Home / Self Care | Payer: Self-pay | Attending: Emergency Medicine | Admitting: Emergency Medicine

## 2014-05-16 DIAGNOSIS — E119 Type 2 diabetes mellitus without complications: Secondary | ICD-10-CM | POA: Insufficient documentation

## 2014-05-16 DIAGNOSIS — J189 Pneumonia, unspecified organism: Secondary | ICD-10-CM

## 2014-05-16 DIAGNOSIS — Z792 Long term (current) use of antibiotics: Secondary | ICD-10-CM | POA: Insufficient documentation

## 2014-05-16 DIAGNOSIS — R609 Edema, unspecified: Secondary | ICD-10-CM | POA: Insufficient documentation

## 2014-05-16 DIAGNOSIS — J159 Unspecified bacterial pneumonia: Secondary | ICD-10-CM | POA: Insufficient documentation

## 2014-05-16 DIAGNOSIS — I10 Essential (primary) hypertension: Secondary | ICD-10-CM | POA: Insufficient documentation

## 2014-05-16 DIAGNOSIS — Z7982 Long term (current) use of aspirin: Secondary | ICD-10-CM | POA: Insufficient documentation

## 2014-05-16 DIAGNOSIS — Z79899 Other long term (current) drug therapy: Secondary | ICD-10-CM | POA: Insufficient documentation

## 2014-05-16 DIAGNOSIS — E669 Obesity, unspecified: Secondary | ICD-10-CM | POA: Insufficient documentation

## 2014-05-16 LAB — BASIC METABOLIC PANEL
BUN: 9 mg/dL (ref 6–23)
CALCIUM: 8.5 mg/dL (ref 8.4–10.5)
CHLORIDE: 103 meq/L (ref 96–112)
CO2: 25 mEq/L (ref 19–32)
Creatinine, Ser: 0.79 mg/dL (ref 0.50–1.10)
GFR calc non Af Amer: >90 mL/min (ref >90)
GLUCOSE: 276 mg/dL — AB (ref 70–99)
POTASSIUM: 4.2 meq/L (ref 3.7–5.3)
Sodium: 139 mEq/L (ref 137–147)

## 2014-05-16 LAB — TROPONIN I

## 2014-05-16 LAB — PRO B NATRIURETIC PEPTIDE: Pro B Natriuretic peptide (BNP): 1273 pg/mL — ABNORMAL HIGH (ref 0–125)

## 2014-05-16 LAB — CBC
HEMATOCRIT: 36.7 % (ref 36.0–46.0)
HEMOGLOBIN: 12.8 g/dL (ref 12.0–15.0)
MCH: 28.2 pg (ref 26.0–34.0)
MCHC: 34.9 g/dL (ref 30.0–36.0)
MCV: 80.8 fL (ref 78.0–100.0)
PLATELETS: 321 10*3/uL (ref 150–400)
RBC: 4.54 MIL/uL (ref 3.87–5.11)
RDW: 12.8 % (ref 11.5–15.5)
WBC: 16.9 10*3/uL — AB (ref 4.0–10.5)

## 2014-05-16 MED ORDER — FUROSEMIDE 20 MG PO TABS: 20.0000 mg | ORAL_TABLET | Freq: Every day | ORAL | Status: DC

## 2014-05-16 MED ORDER — DOXYCYCLINE HYCLATE 100 MG PO CAPS: 100.0000 mg | ORAL_CAPSULE | Freq: Two times a day (BID) | ORAL | Status: DC

## 2014-05-16 NOTE — ED Provider Notes (Signed)
CSN: [id number]     Arrival date & time 05/16/14  1055 History  This chart was scribed for American Expressathan R. Rubin PayorPickering, MD by Ardelia Memsylan Malpass, ED Scribe. This patient was seen in room APA12/APA12 and the patient's care was started at 12:30 PM.   Chief Complaint  Patient presents with  . Leg Swelling    The history is provided by the patient. No language interpreter was used.    HPI Comments: Kristen Briggs is a 48 y.o. Female with a history of DM and HTN who presents to the Emergency Department complaining of bilateral lower leg swelling over the past week. She reports associated "tightness" in the right side of her chest and SOB over the past 2 days. She denies any chest "pain". She states that she has had a mild non-productive cough recently. She denies any recent sick contacts. She denies fever, increase or decrease in urinary frequency or any other symptoms.   Past Medical History  Diagnosis Date  . Diabetes mellitus   . Hypertension    Past Surgical History  Procedure Laterality Date  . Cesarean section     History reviewed. No pertinent family history. History  Substance Use Topics  . Smoking status: Never Smoker   . Smokeless tobacco: Not on file  . Alcohol Use: No   OB History   Grav Para Term Preterm Abortions TAB SAB Ect Mult Living                 Review of Systems  Constitutional: Negative for fever.  Respiratory: Positive for cough, chest tightness and shortness of breath.   Cardiovascular: Positive for leg swelling. Negative for chest pain.  Genitourinary: Negative for frequency.  All other systems reviewed and are negative.   Allergies  Review of patient's allergies indicates no known allergies.  Home Medications   Prior to Admission medications   Medication Sig Start Date End Date Taking? Authorizing Lillian Tigges  aspirin EC 81 MG tablet Take 81 mg by mouth daily.   Yes Historical Sameeha Rockefeller, MD  carvedilol (COREG) 25 MG tablet Take 25 mg by mouth 2 (two) times  daily with a meal.   Yes Historical Ladainian Therien, MD  hydrochlorothiazide (HYDRODIURIL) 25 MG tablet Take 25 mg by mouth daily.   Yes Historical Jonah Nestle, MD  insulin detemir (LEVEMIR) 100 UNIT/ML injection Inject 35 Units into the skin at bedtime.    Yes Historical Micheline Markes, MD  lisinopril (PRINIVIL,ZESTRIL) 20 MG tablet Take 20 mg by mouth 2 (two) times daily.   Yes Historical Rasheema Truluck, MD  sitaGLIPtin (JANUVIA) 100 MG tablet Take 100 mg by mouth daily.   Yes Historical Thorsten Climer, MD  doxycycline (VIBRAMYCIN) 100 MG capsule Take 1 capsule (100 mg total) by mouth 2 (two) times daily. 05/16/14   Juliet RudeNathan R. Pickering, MD  furosemide (LASIX) 20 MG tablet Take 1 tablet (20 mg total) by mouth daily. 05/16/14   Juliet RudeNathan R. Rubin PayorPickering, MD   Triage Vitals: BP 175/69  Pulse 62  Temp(Src) 98.4 F (36.9 C) (Oral)  Resp 18  Ht 5\' 6"  (1.676 m)  Wt 327 lb (148.326 kg)  BMI 52.80 kg/m2  SpO2 98%  LMP 04/25/2014  Physical Exam  Nursing note and vitals reviewed. Constitutional: She is oriented to person, place, and time. She appears well-developed and well-nourished. No distress.  Obese  HENT:  Head: Normocephalic and atraumatic.  Eyes: Conjunctivae and EOM are normal.  Neck: Neck supple. No JVD present. No tracheal deviation present.  Cardiovascular: Normal rate.  No murmur heard. Occasional skipped beat. Strong DP pulses bilaterally.  Pulmonary/Chest: Effort normal. No respiratory distress.  Musculoskeletal: Normal range of motion. She exhibits edema.  Bilateral lower extremity 2+ pitting edema, without erythema  Neurological: She is alert and oriented to person, place, and time.  Skin: Skin is warm and dry.  Psychiatric: She has a normal mood and affect. Her behavior is normal.    ED Course  Procedures (including critical care time)  DIAGNOSTIC STUDIES: Oxygen Saturation is 98% on RA, normal by my interpretation.    COORDINATION OF CARE: 12:35 PM- Discussed lab and CXR findings. Discussed  clinical suspicion that pt has pneumonia, and that she does not have DVT/PE. Discussed plan to treat for pneumonia. Pt advised of plan for treatment and pt agrees.  Labs Review Labs Reviewed  CBC - Abnormal; Notable for the following:    WBC 16.9 (*)    All other components within normal limits  BASIC METABOLIC PANEL - Abnormal; Notable for the following:    Glucose, Bld 276 (*)    All other components within normal limits  PRO B NATRIURETIC PEPTIDE - Abnormal; Notable for the following:    Pro B Natriuretic peptide (BNP) 1273.0 (*)    All other components within normal limits  TROPONIN I    Imaging Review Dg Chest 2 View (if Patient Has Fever And/or Copd)  05/16/2014   CLINICAL DATA:  Leg swelling.  EXAM: CHEST  2 VIEW  COMPARISON:  None.  FINDINGS: Mediastinum hilar structures are normal. Borderline cardiomegaly cannot be excluded. Mild left lower lobe infiltrate. This could be secondary to pneumonia. Small left effusion. Degenerative changes thoracic spine.  IMPRESSION: 1. Mild left lower lobe infiltrate with small left pleural effusion. 2. Borderline cardiomegaly.   Electronically Signed   By: Maisie Fushomas  Register   On: 05/16/2014 11:36     EKG Interpretation   Date/Time:  Monday May 16 2014 11:39:35 EDT Ventricular Rate:  64 PR Interval:  160 QRS Duration: 74 QT Interval:  618 QTC Calculation: 638 R Axis:   32 Text Interpretation:  Sinus rhythm Low voltage, precordial leads  Borderline T abnormalities, anterior leads Prolonged QT interval Confirmed  by Rubin PayorPICKERING  MD, Harrold DonathNATHAN [phone]([id number]) on 05/16/2014 12:39:09 PM      MDM   Final diagnoses:  CAP (community acquired pneumonia)  Peripheral edema    Patient with bilateral leg swelling. Mild CHF/lower sternal he edema. No pulmonary edema. Does have pneumonia on x-ray. Will discharge home with antibiotics and a short course of Lasix will be to follow with her PCP   I personally performed the services described in this  documentation, which was scribed in my presence. The recorded information has been reviewed and is accurate.    Juliet RudeNathan R. Rubin PayorPickering, MD 05/16/14 [phone]

## 2014-05-16 NOTE — ED Notes (Signed)
Pt called for triage. Pt not in waiting room.

## 2014-05-16 NOTE — ED Notes (Signed)
Pt reports bilateral lower extremity leg swelling x1 week, sob,chest tightness x few days. Pt sent from PCP for abnormal EKG and weight gain. Pt denies any cp,sob in triage. Pt received breathing treatment at pcp office prior to arrival. nad noted.

## 2014-05-16 NOTE — Discharge Instructions (Signed)
Edema °Edema is an abnormal buildup of fluids in your body tissues. Edema is somewhat dependent on gravity to pull the fluid to the lowest place in your body. That makes the condition more common in the legs and thighs (lower extremities). Painless swelling of the feet and ankles is common and becomes more likely as you get older. It is also common in looser tissues, like around your eyes.  °When the affected area is squeezed, the fluid may move out of that spot and leave a dent for a few moments. This dent is called pitting.  °CAUSES  °There are many possible causes of edema. Eating too much salt and being on your feet or sitting for a long time can cause edema in your legs and ankles. Hot weather may make edema worse. Common medical causes of edema include: °· Heart failure. °· Liver disease. °· Kidney disease. °· Weak blood vessels in your legs. °· Cancer. °· An injury. °· Pregnancy. °· Some medications. °· Obesity.  °SYMPTOMS  °Edema is usually painless. Your skin may look swollen or shiny.  °DIAGNOSIS  °Your health care provider may be able to diagnose edema by asking about your medical history and doing a physical exam. You may need to have tests such as X-rays, an electrocardiogram, or blood tests to check for medical conditions that may cause edema.  °TREATMENT  °Edema treatment depends on the cause. If you have heart, liver, or kidney disease, you need the treatment appropriate for these conditions. General treatment may include: °· Elevation of the affected body part above the level of your heart. °· Compression of the affected body part. Pressure from elastic bandages or support stockings squeezes the tissues and forces fluid back into the blood vessels. This keeps fluid from entering the tissues. °· Restriction of fluid and salt intake. °· Use of a water pill (diuretic). These medications are appropriate only for some types of edema. They pull fluid out of your body and make you urinate more often. This  gets rid of fluid and reduces swelling, but diuretics can have side effects. Only use diuretics as directed by your health care provider. °HOME CARE INSTRUCTIONS  °· Keep the affected body part above the level of your heart when you are lying down.   °· Do not sit still or stand for prolonged periods.   °· Do not put anything directly under your knees when lying down. °· Do not wear constricting clothing or garters on your upper legs.   °· Exercise your legs to work the fluid back into your blood vessels. This may help the swelling go down.   °· Wear elastic bandages or support stockings to reduce ankle swelling as directed by your health care provider.   °· Eat a low-salt diet to reduce fluid if your health care provider recommends it.   °· Only take medicines as directed by your health care provider.  °SEEK MEDICAL CARE IF:  °· Your edema is not responding to treatment. °· You have heart, liver, or kidney disease and notice symptoms of edema. °· You have edema in your legs that does not improve after elevating them.   °· You have sudden and unexplained weight gain. °SEEK IMMEDIATE MEDICAL CARE IF:  °· You develop shortness of breath or chest pain.   °· You cannot breathe when you lie down. °· You develop pain, redness, or warmth in the swollen areas.   °· You have heart, liver, or kidney disease and suddenly get edema. °· You have a fever and your symptoms suddenly get worse. °MAKE SURE YOU:  °·   Understand these instructions.  Will watch your condition.  Will get help right away if you are not doing well or get worse. Document Released: 11/11/2005 Document Revised: 11/16/2013 Document Reviewed: 09/03/2013 [facility] Patient Information 2015 [employer], [place]. This information is not intended to replace advice given to you by your health care provider. Make sure you discuss any questions you have with your health care provider.  Pneumonia, Adult Pneumonia is an infection of the lungs.  CAUSES Pneumonia may  be caused by bacteria or a virus. Usually, these infections are caused by breathing infectious particles into the lungs (respiratory tract). SYMPTOMS   Cough.  Fever.  Chest pain.  Increased rate of breathing.  Wheezing.  Mucus production. DIAGNOSIS  If you have the common symptoms of pneumonia, your caregiver will typically confirm the diagnosis with a chest X-ray. The X-ray will show an abnormality in the lung (pulmonary infiltrate) if you have pneumonia. Other tests of your blood, urine, or sputum may be done to find the specific cause of your pneumonia. Your caregiver may also do tests (blood gases or pulse oximetry) to see how well your lungs are working. TREATMENT  Some forms of pneumonia may be spread to other people when you cough or sneeze. You may be asked to wear a mask before and during your exam. Pneumonia that is caused by bacteria is treated with antibiotic medicine. Pneumonia that is caused by the influenza virus may be treated with an antiviral medicine. Most other viral infections must run their course. These infections will not respond to antibiotics.  PREVENTION A pneumococcal shot (vaccine) is available to prevent a common bacterial cause of pneumonia. This is usually suggested for:  People over 46 years old.  Patients on chemotherapy.  People with chronic lung problems, such as bronchitis or emphysema.  People with immune system problems. If you are over 65 or have a high risk condition, you may receive the pneumococcal vaccine if you have not received it before. In some countries, a routine influenza vaccine is also recommended. This vaccine can help prevent some cases of pneumonia.You may be offered the influenza vaccine as part of your care. If you smoke, it is time to quit. You may receive instructions on how to stop smoking. Your caregiver can provide medicines and counseling to help you quit. HOME CARE INSTRUCTIONS   Cough suppressants may be used if you  are losing too much rest. However, coughing protects you by clearing your lungs. You should avoid using cough suppressants if you can.  Your caregiver may have prescribed medicine if he or she thinks your pneumonia is caused by a bacteria or influenza. Finish your medicine even if you start to feel better.  Your caregiver may also prescribe an expectorant. This loosens the mucus to be coughed up.  Only take over-the-counter or prescription medicines for pain, discomfort, or fever as directed by your caregiver.  Do not smoke. Smoking is a common cause of bronchitis and can contribute to pneumonia. If you are a smoker and continue to smoke, your cough may last several weeks after your pneumonia has cleared.  A cold steam vaporizer or humidifier in your room or home may help loosen mucus.  Coughing is often worse at night. Sleeping in a semi-upright position in a recliner or using a couple pillows under your head will help with this.  Get rest as you feel it is needed. Your body will usually let you know when you need to rest. SEEK IMMEDIATE MEDICAL CARE  IF:   Your illness becomes worse. This is especially true if you are elderly or weakened from any other disease.  You cannot control your cough with suppressants and are losing sleep.  You begin coughing up blood.  You develop pain which is getting worse or is uncontrolled with medicines.  You have a fever.  Any of the symptoms which initially brought you in for treatment are getting worse rather than better.  You develop shortness of breath or chest pain. MAKE SURE YOU:   Understand these instructions.  Will watch your condition.  Will get help right away if you are not doing well or get worse. Document Released: 11/11/2005 Document Revised: 02/03/2012 Document Reviewed: 01/31/2011 [facility]hesExitCare Patient Information 2015 [place], [place]. This information is not intended to replace advice given to you by your health care provider. Make  sure you discuss any questions you have with your health care provider.

## 2015-01-04 ENCOUNTER — Ambulatory Visit: Payer: Self-pay

## 2015-04-05 IMAGING — CT CT ABD-PELV W/ CM
2 of 4 series · 16 of 46 positions shown, 18 images · IV contrast (omnipaque)
Comparison: None.

ADDENDUM:
Impression 6: Low-density nodule left adrenal gland measures 1.9 cm.
This may represent adrenal adenoma. Follow-up examination in 6
months is suggested to assure stability.
CLINICAL DATA: Abdominal pain, nausea

EXAM:
CT ABDOMEN AND PELVIS WITH CONTRAST
TECHNIQUE: Multidetector CT imaging of the abdomen and pelvis was performed
using the standard protocol following bolus administration of
intravenous contrast.
CONTRAST:  50mL OMNIPAQUE IOHEXOL 300 MG/ML SOLN, 100mL OMNIPAQUE
IOHEXOL 300 MG/ML SOLN

[Series 2: abd_pel_with 5.0 b40f · axial · 0.94mm/px · z∈[-625,-195]mm · 13 of 96 slices shown, 15 images]
[im 5/96  soft-tissue]
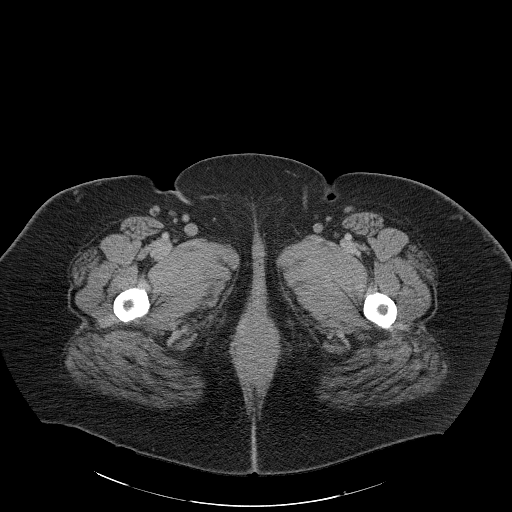
[im 5/96  bone]
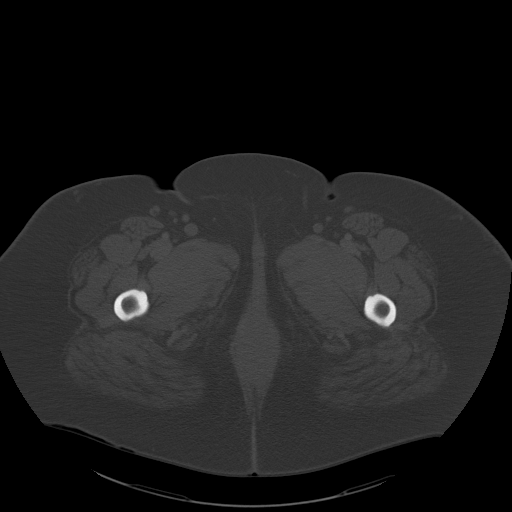
[im 14/96  soft-tissue]
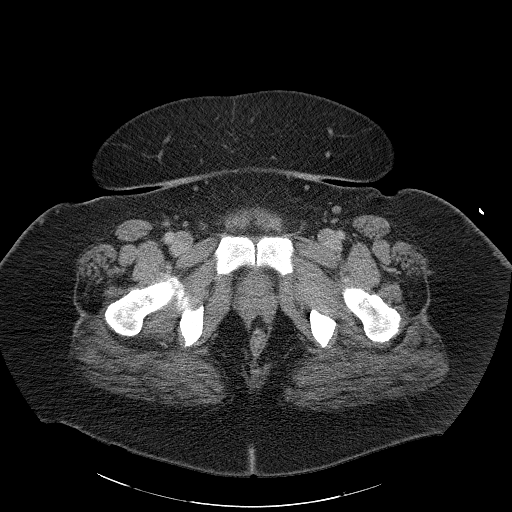
[im 19/96  soft-tissue]
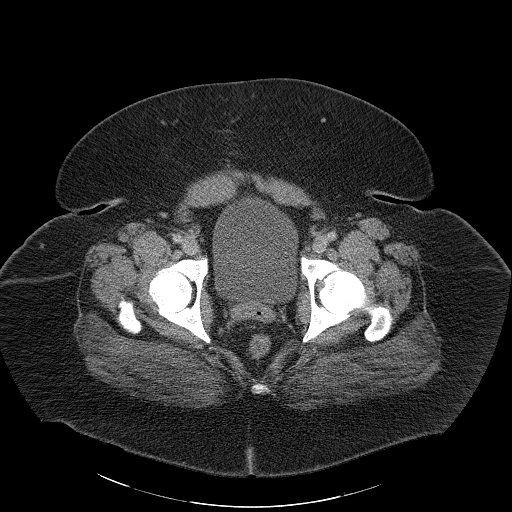
[im 28/96  soft-tissue]
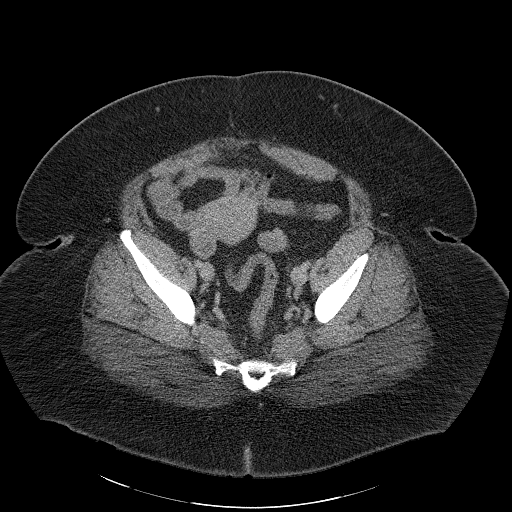
[im 32/96  soft-tissue]
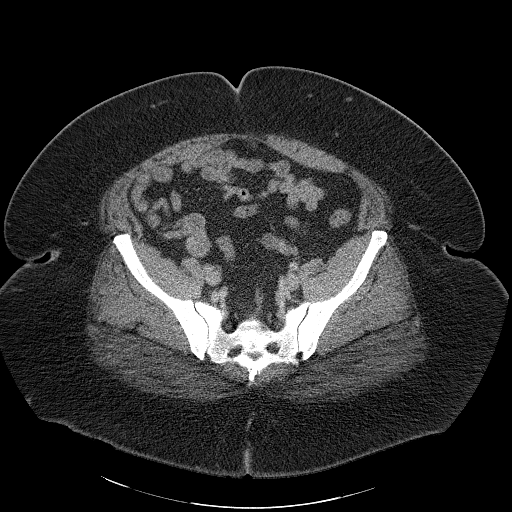
[im 41/96  soft-tissue]
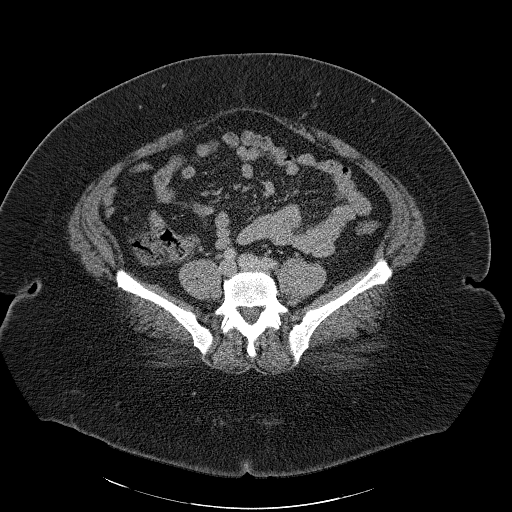
[im 50/96  soft-tissue]
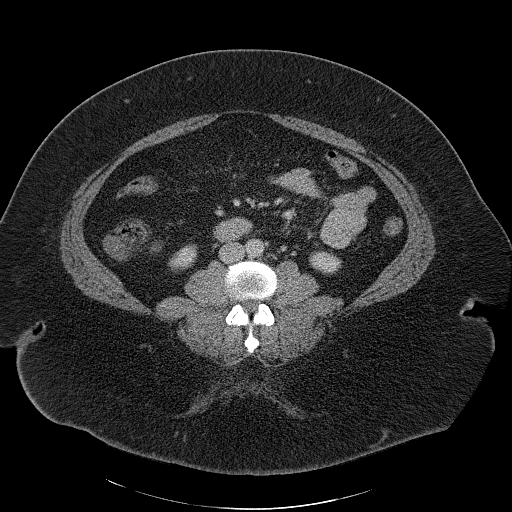
[im 55/96  soft-tissue]
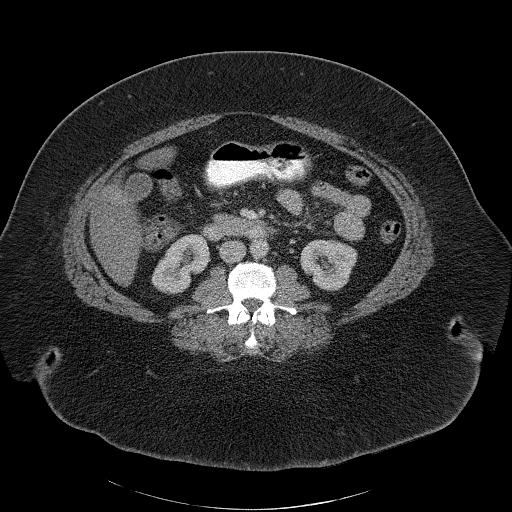
[im 64/96  soft-tissue]
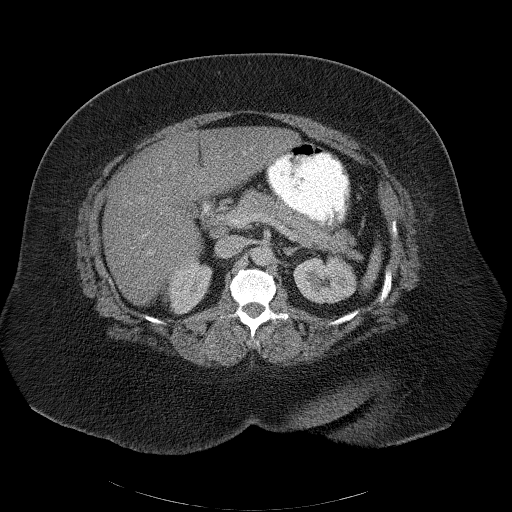
[im 64/96  bone]
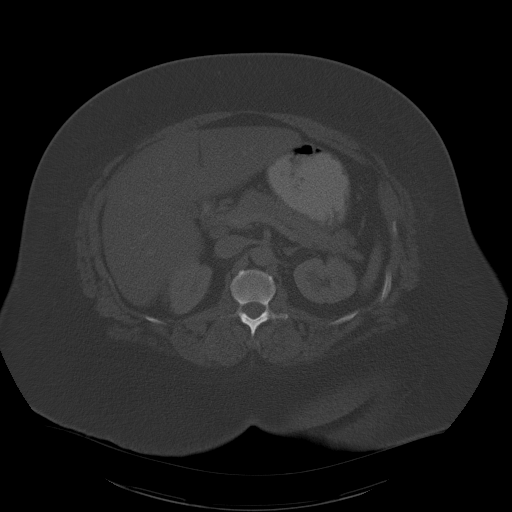
[im 68/96  soft-tissue]
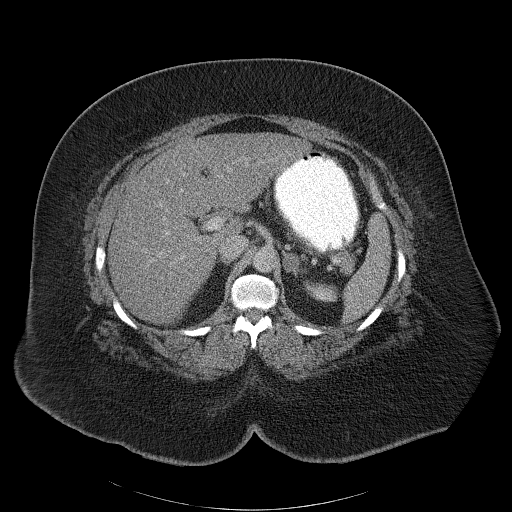
[im 77/96  soft-tissue]
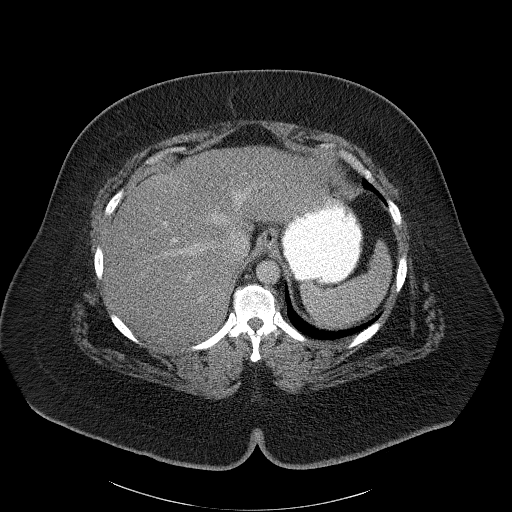
[im 82/96  soft-tissue]
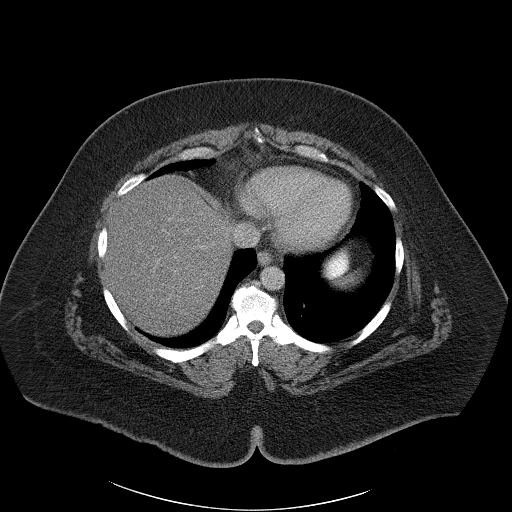
[im 91/96  soft-tissue]
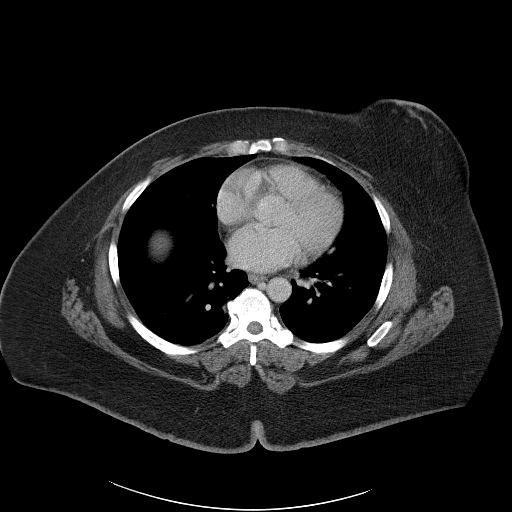

[Series 4: abd_pel_with 3.0 spo cor · coronal · 0.95mm/px · 3 of 112 slices shown]
[im 38/112  soft-tissue]
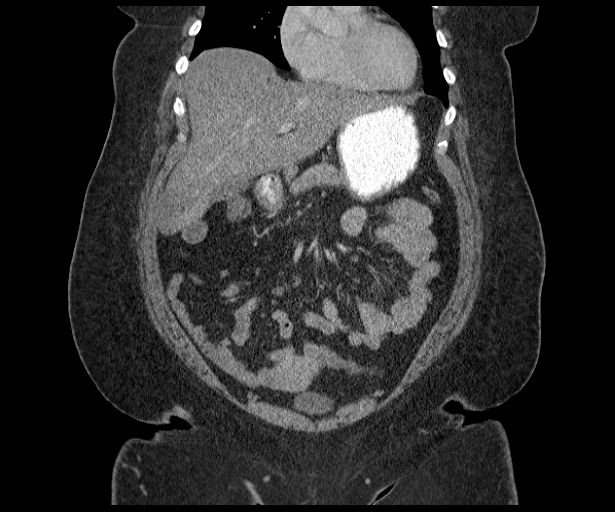
[im 50/112  soft-tissue]
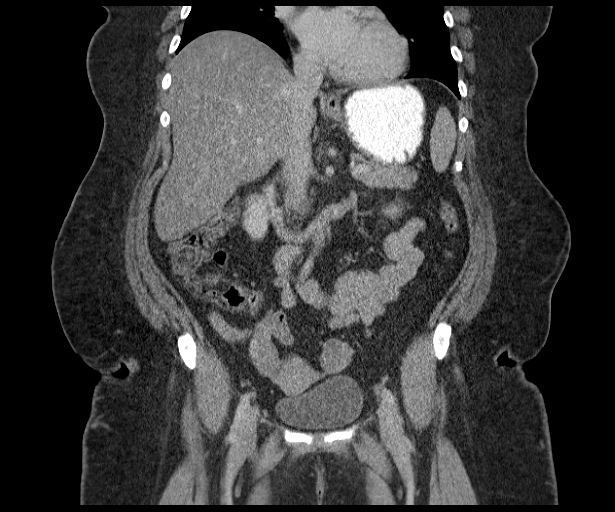
[im 62/112  soft-tissue]
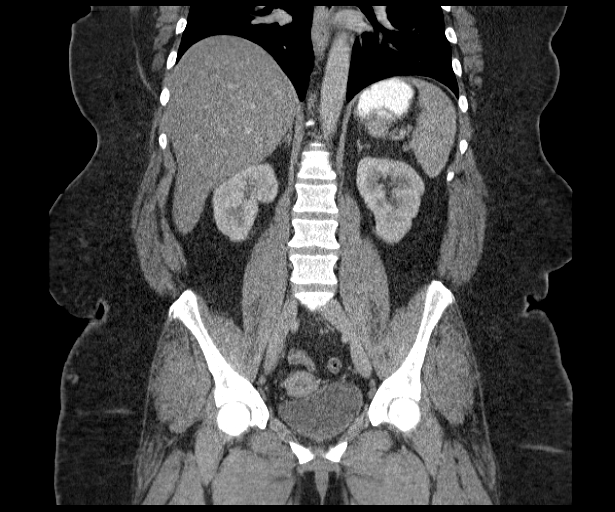

[16 of 46 positions shown; findings below may reference images not displayed]

FINDINGS: Lung bases are unremarkable. Sagittal images of the spine shows mild
degenerative changes lower thoracic spine.

There is fatty infiltration of the liver. The gallbladder is
contracted without evidence of calcified gallstones. No
pericholecystic fluid.

The pancreas, spleen and right adrenal is unremarkable. There is a
low density nodule left adrenal gland measures 1.9 cm and 22
Hounsfield units in attenuation. This probable represents another
enema.

Abdominal aorta is unremarkable. No small bowel obstruction. No
ascites or free air. No adenopathy.

There is no pericecal inflammation. The terminal ileum is
unremarkable. The appendix is not clearly identified.

There is nonspecific minimal thickening of colonic wall in hepatic
flexure of the colon axial image 40 and sagittal image 49. Clinical
correlation is necessary to exclude minimal colitis. There is no
surrounding mesenteric stranding or fluid.

No colonic obstruction.

There is some fluid noted within endocervical canal of the uterus.
No adnexal masses noted. No inguinal adenopathy. No destructive bony
lesions are noted within pelvis. The urinary bladder is
unremarkable.

Kidneys are symmetrical in size and enhancement. No hydronephrosis
or hydroureter.

Delayed renal images shows bilateral renal symmetrical excretion.
Bilateral visualized proximal ureter is unremarkable.
IMPRESSION: 1. There is fatty infiltration of the liver.  No focal hepatic mass.
2. Contracted gallbladder without evidence of calcified gallstones.
3. Nonspecific minimal thickening of colonic wall in hepatic flexure
of the colon. Minimal short segment colitis cannot be excluded.
Clinical correlation is necessary. There is no surrounding fluid or
inflammation.
4. No pericecal inflammation. The appendix is not clearly
identified.
5. Small amount of fluid in endocervical canal of the uterus. No
adnexal mass.

## 2015-05-15 IMAGING — CR DG CHEST 2V
2 series · 2 of 2 positions shown · non-contrast
Comparison: None.

CLINICAL DATA: Leg swelling.

EXAM:
CHEST  2 VIEW

[view not recorded (1 of 2)]
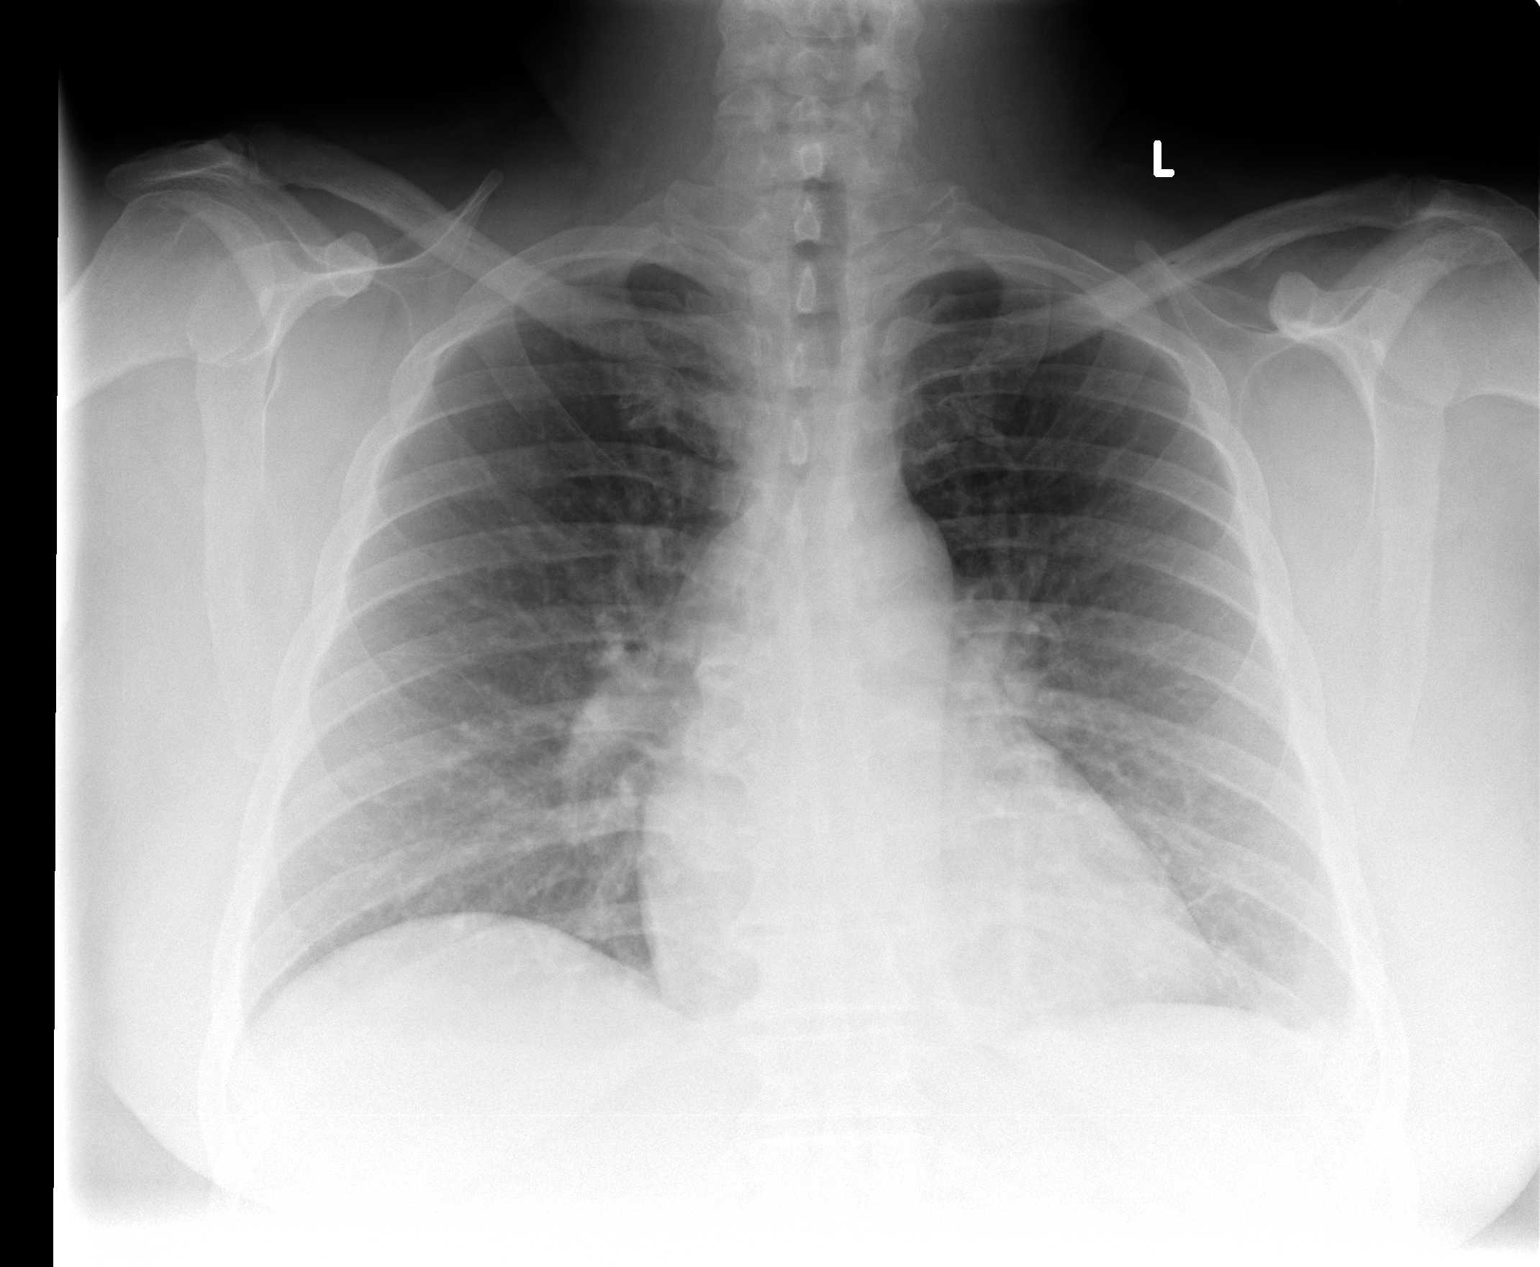

[view not recorded (2 of 2)]
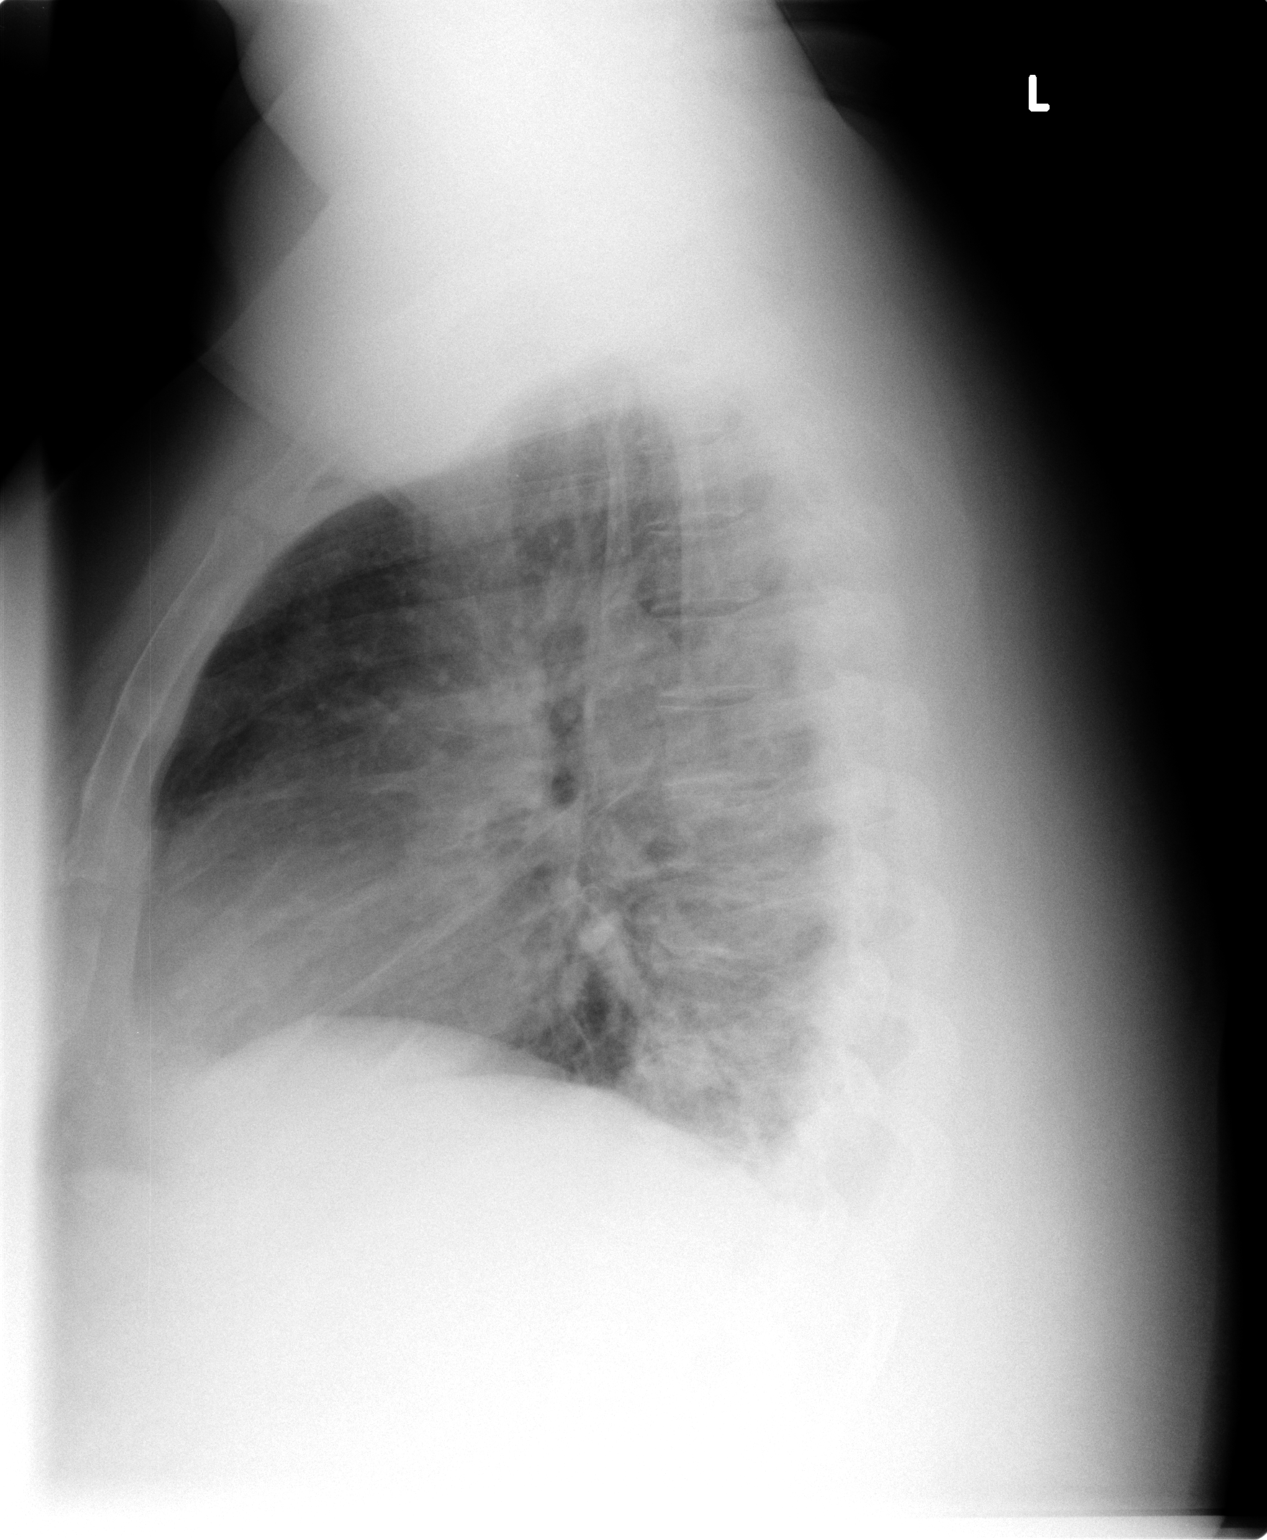

[2 of 2 positions shown; findings below may reference images not displayed]

FINDINGS: Mediastinum hilar structures are normal. Borderline cardiomegaly
cannot be excluded. Mild left lower lobe infiltrate. This could be
secondary to pneumonia. Small left effusion. Degenerative changes
thoracic spine.
IMPRESSION: 1. Mild left lower lobe infiltrate with small left pleural effusion.
2. Borderline cardiomegaly.

## 2017-10-02 ENCOUNTER — Emergency Department (HOSPITAL_COMMUNITY)
Admission: EM | Admit: 2017-10-02 | Discharge: 2017-10-02 | Disposition: A | Discharge status: Home / Self Care | Payer: Self-pay | Attending: Emergency Medicine | Admitting: Emergency Medicine

## 2017-10-02 ENCOUNTER — Other Ambulatory Visit: Payer: Self-pay

## 2017-10-02 ENCOUNTER — Encounter (HOSPITAL_COMMUNITY): Payer: Self-pay | Admitting: *Deleted

## 2017-10-02 DIAGNOSIS — E119 Type 2 diabetes mellitus without complications: Secondary | ICD-10-CM | POA: Insufficient documentation

## 2017-10-02 DIAGNOSIS — I10 Essential (primary) hypertension: Secondary | ICD-10-CM | POA: Insufficient documentation

## 2017-10-02 DIAGNOSIS — Z794 Long term (current) use of insulin: Secondary | ICD-10-CM | POA: Insufficient documentation

## 2017-10-02 DIAGNOSIS — B029 Zoster without complications: Secondary | ICD-10-CM | POA: Insufficient documentation

## 2017-10-02 DIAGNOSIS — R600 Localized edema: Secondary | ICD-10-CM | POA: Insufficient documentation

## 2017-10-02 DIAGNOSIS — Z79899 Other long term (current) drug therapy: Secondary | ICD-10-CM | POA: Insufficient documentation

## 2017-10-02 MED ORDER — ONDANSETRON 4 MG PO TBDP
4.0000 mg | ORAL_TABLET | Freq: Once | ORAL | Status: AC
  Administered 2017-10-02: 4 mg via ORAL
  Filled 2017-10-02: qty 1

## 2017-10-02 MED ORDER — ACYCLOVIR 400 MG PO TABS: 800.0000 mg | ORAL_TABLET | Freq: Every day | ORAL | 0 refills | Status: AC

## 2017-10-02 MED ORDER — PREDNISONE 50 MG PO TABS
60.0000 mg | ORAL_TABLET | Freq: Once | ORAL | Status: AC
  Administered 2017-10-02: 60 mg via ORAL
  Filled 2017-10-02: qty 1

## 2017-10-02 MED ORDER — PREDNISONE 10 MG PO TABS: 40.0000 mg | ORAL_TABLET | Freq: Every day | ORAL | 0 refills | Status: DC

## 2017-10-02 NOTE — ED Triage Notes (Addendum)
Pt c/o bumps to right eyebrow, right eye drainage, right facial swelling, clear nasal drainage, clear productive cough, sinus headache x couple of days. Denies dental pain. Pt reports she has had shingles in the past which presented in the same way as the bumps to her eyebrow do today. No drainage from bumps on eyebrow at this time. Consulting civil engineerCharge RN notified. No negative pressure rooms available at this time. Pt placed behind triage in waiting room.

## 2017-10-02 NOTE — Discharge Instructions (Signed)
Follow-up with your primary care for consideration for shingles vaccination layer.  Also follow-up to make sure that things heal well get seen by her primary care or return here for any involvement of the right eye.  Take prednisone as directed.  Take the acyclovir as directed.

## 2017-10-02 NOTE — ED Provider Notes (Signed)
[facility] EMERGENCY DEPARTMENT Provider Note   CSN: [id number] Arrival date & time: 10/02/17  1135     History   Chief Complaint Chief Complaint  Patient presents with  . Facial Swelling    possible shingles?    HPI Kristen MossesSherry D Laffey is a 51 y.o. female.  Patient presents with a rash to right eyebrow area and swelling in that area had some discomfort there for the past couple of days.  The rash showed up late on Tuesday.  Patient states that she has had shingles in this area in the past.  Very similar presentation.  Patient denies any pain to the eye itself.  Has somewhat of an ache behind the eye but no pain to the front of the eye.  In addition patient's had an upper respiratory infection recently.  She feels that this may have triggered the outbreak of the rash.  Patient denies having the herpes zoster immunization.  She did have chickenpox when she was younger.       Past Medical History:  Diagnosis Date  . Diabetes mellitus   . Hypertension     There are no active problems to display for this patient.   Past Surgical History:  Procedure Laterality Date  . CESAREAN SECTION      OB History    No data available       Home Medications    Prior to Admission medications   Medication Sig Start Date End Date Taking? Authorizing Provider  acyclovir (ZOVIRAX) 400 MG tablet Take 2 tablets (800 mg total) 5 (five) times daily for 10 days by mouth. 10/02/17 10/12/17  Vanetta MuldersZackowski, Rachell Druckenmiller, MD  aspirin EC 81 MG tablet Take 81 mg by mouth daily.    [provider]  carvedilol (COREG) 25 MG tablet Take 25 mg by mouth 2 (two) times daily with a meal.    [provider]  doxycycline (VIBRAMYCIN) 100 MG capsule Take 1 capsule (100 mg total) by mouth 2 (two) times daily. 05/16/14   Benjiman CorePickering, Nathan, MD  furosemide (LASIX) 20 MG tablet Take 1 tablet (20 mg total) by mouth daily. 05/16/14   Benjiman CorePickering, Nathan, MD  hydrochlorothiazide (HYDRODIURIL) 25 MG tablet Take 25 mg  by mouth daily.    [provider]  insulin detemir (LEVEMIR) 100 UNIT/ML injection Inject 35 Units into the skin at bedtime.     [provider]  lisinopril (PRINIVIL,ZESTRIL) 20 MG tablet Take 20 mg by mouth 2 (two) times daily.    [provider]  predniSONE (DELTASONE) 10 MG tablet Take 4 tablets (40 mg total) daily by mouth. 10/02/17   Vanetta MuldersZackowski, Dudley Cooley, MD  sitaGLIPtin (JANUVIA) 100 MG tablet Take 100 mg by mouth daily.    [provider]    Family History No family history on file.  Social History Social History   Tobacco Use  . Smoking status: Never Smoker  . Smokeless tobacco: Never Used  Substance Use Topics  . Alcohol use: No  . Drug use: No     Allergies   Patient has no known allergies.   Review of Systems Review of Systems  Constitutional: Negative for fever.  HENT: Positive for congestion and facial swelling.   Eyes: Positive for pain.  Respiratory: Positive for cough. Negative for shortness of breath.   Cardiovascular: Negative for chest pain.  Gastrointestinal: Negative for abdominal pain, diarrhea, nausea and vomiting.  Genitourinary: Negative for dysuria.  Musculoskeletal: Negative for back pain.  Skin: Positive for rash.  Neurological: Negative for headaches.  Psychiatric/Behavioral: Negative for confusion.     Physical Exam Updated Vital Signs BP (!) 183/91   Pulse 76   Temp 98.6 F (37 C) (Oral)   Resp 16   Ht 1.676 m (5\' 6" )   Wt 115.7 kg (255 lb)   LMP 09/15/2017   SpO2 100%   BMI 41.16 kg/m   Physical Exam  Constitutional: She is oriented to person, place, and time. She appears well-developed and well-nourished. No distress.  HENT:  Head: Normocephalic and atraumatic.  Mouth/Throat: Oropharynx is clear and moist. No oropharyngeal exudate.  Vesicular lesions to the eyebrow on the right side.  And to right temporal area.  With erythematous base.  Does not cross the midline.  Eyes: Conjunctivae and  EOM are normal. Pupils are equal, round, and reactive to light. Right eye exhibits no discharge. Left eye exhibits no discharge.  Lesions are all in the upper eyebrow area.  No lesions to the lower part of the lid.  No lesions to the bridge of the nose.  No evidence of any lesions on the cornea of the right eye.  Neck: Normal range of motion. Neck supple.  Cardiovascular: Normal rate and regular rhythm.  Pulmonary/Chest: Effort normal and breath sounds normal. No respiratory distress.  Abdominal: Soft. Bowel sounds are normal. There is no tenderness.  Musculoskeletal: Normal range of motion. She exhibits no edema.  Neurological: She is alert and oriented to person, place, and time. No cranial nerve deficit or sensory deficit. She exhibits normal muscle tone. Coordination normal.  Skin: Skin is warm. Rash noted.  Nursing note and vitals reviewed.    ED Treatments / Results  Labs (all labs ordered are listed, but only abnormal results are displayed) Labs Reviewed - No data to display  EKG  EKG Interpretation None       Radiology No results found.  Procedures Procedures (including critical care time)  Medications Ordered in ED Medications  predniSONE (DELTASONE) tablet 60 mg (not administered)     Initial Impression / Assessment and Plan / ED Course  I have reviewed the triage vital signs and the nursing notes.  Pertinent labs & imaging results that were available during my care of the patient were reviewed by me and considered in my medical decision making (see chart for details).    Rash in the right eyebrow area seems to be consistent with herpes zoster.  No evidence of any corneal involvement at this time.  Patient will require close follow-up.  Patient be started on prednisone and acyclovir.  Patient may be a candidate for the zoster immunizations outbreak.  Most likely upper respiratory infection which does not seem to be a complication but probably did trigger the  outbreak of the shingles.  Patient nontoxic no acute distress.   Final Clinical Impressions(s) / ED Diagnoses   Final diagnoses:  Herpes zoster without complication    ED Discharge Orders        Ordered    acyclovir (ZOVIRAX) 400 MG tablet  5 times daily     10/02/17 1508    predniSONE (DELTASONE) 10 MG tablet  Daily     10/02/17 1508       Vanetta MuldersZackowski, Myelle Poteat, MD 10/02/17 [id number]

## 2017-10-05 ENCOUNTER — Encounter (HOSPITAL_COMMUNITY): Payer: Self-pay

## 2017-10-05 ENCOUNTER — Emergency Department (HOSPITAL_COMMUNITY)
Admission: EM | Admit: 2017-10-05 | Discharge: 2017-10-05 | Disposition: A | Discharge status: Home / Self Care | Payer: Self-pay | Attending: Emergency Medicine | Admitting: Emergency Medicine

## 2017-10-05 DIAGNOSIS — E119 Type 2 diabetes mellitus without complications: Secondary | ICD-10-CM | POA: Insufficient documentation

## 2017-10-05 DIAGNOSIS — B0230 Zoster ocular disease, unspecified: Secondary | ICD-10-CM | POA: Insufficient documentation

## 2017-10-05 DIAGNOSIS — B029 Zoster without complications: Secondary | ICD-10-CM | POA: Insufficient documentation

## 2017-10-05 DIAGNOSIS — I10 Essential (primary) hypertension: Secondary | ICD-10-CM | POA: Insufficient documentation

## 2017-10-05 DIAGNOSIS — M542 Cervicalgia: Secondary | ICD-10-CM | POA: Insufficient documentation

## 2017-10-05 DIAGNOSIS — H5711 Ocular pain, right eye: Secondary | ICD-10-CM | POA: Insufficient documentation

## 2017-10-05 MED ORDER — TETRACAINE HCL 0.5 % OP SOLN
2.0000 [drp] | Freq: Once | OPHTHALMIC | Status: AC
  Administered 2017-10-05: 2 [drp] via OPHTHALMIC
  Filled 2017-10-05: qty 4

## 2017-10-05 MED ORDER — OXYCODONE-ACETAMINOPHEN 5-325 MG PO TABS
2.0000 | ORAL_TABLET | Freq: Once | ORAL | Status: AC
  Administered 2017-10-05: 2 via ORAL
  Filled 2017-10-05: qty 2

## 2017-10-05 MED ORDER — OXYCODONE-ACETAMINOPHEN 5-325 MG PO TABS: 1.0000 | ORAL_TABLET | Freq: Four times a day (QID) | ORAL | 0 refills | Status: DC | PRN

## 2017-10-05 MED ORDER — GABAPENTIN 100 MG PO CAPS
100.0000 mg | ORAL_CAPSULE | Freq: Once | ORAL | Status: AC
  Administered 2017-10-05: 100 mg via ORAL
  Filled 2017-10-05: qty 1

## 2017-10-05 MED ORDER — TETRAHYDROZOLINE HCL 0.05 % OP SOLN
1.0000 [drp] | OPHTHALMIC | Status: DC | PRN
  Administered 2017-10-05: 2 [drp] via OPHTHALMIC
  Filled 2017-10-05: qty 15

## 2017-10-05 MED ORDER — FLUORESCEIN SODIUM 1 MG OP STRP
1.0000 | ORAL_STRIP | Freq: Once | OPHTHALMIC | Status: AC
  Administered 2017-10-05: 1 via OPHTHALMIC
  Filled 2017-10-05: qty 1

## 2017-10-05 MED ORDER — NAPHAZOLINE-GLYCERIN 0.012-0.2 % OP SOLN
1.0000 [drp] | OPHTHALMIC | Status: DC | PRN
  Filled 2017-10-05: qty 15

## 2017-10-05 MED ORDER — GABAPENTIN 100 MG PO CAPS: 100.0000 mg | ORAL_CAPSULE | Freq: Three times a day (TID) | ORAL | 0 refills | Status: DC

## 2017-10-05 NOTE — ED Provider Notes (Signed)
Emergency Department Provider Note   I have reviewed the triage vital signs and the nursing notes.   HISTORY  Chief Complaint Herpes Zoster   HPI Kristen MossesSherry D Coggeshall is a 51 y.o. female who was seen here couple days ago and diagnosed with likely herpes zoster of the upper facial dermatome but not involving the eye.  Since that time the lesions have worsened and swelling is worsened to include her right eyelid and having a little bit of right sided eye pain with lateral eye movement.  She states she has been taking the acyclovir and the steroids but it seems worsening with that.  The pain is gotten a lot worse.  She has some pain down and around the back of her neck as well.  She has had shingles before similar to this but not quite as extensive.  She has no blurry vision or vision changes otherwise.   Past Medical History:  Diagnosis Date  . Diabetes mellitus   . Hypertension     There are no active problems to display for this patient.   Past Surgical History:  Procedure Laterality Date  . CESAREAN SECTION      Current Outpatient Rx  . Order #: [id number] Class: Print  . Order #: [id number] Class: Historical Med  . Order #: [id number] Class: Historical Med  . Order #: [id number] Class: Print  . Order #: [id number] Class: Print  . Order #: [id number] Class: Print  . Order #: [id number] Class: Historical Med  . Order #: [id number] Class: Historical Med  . Order #: [id number] Class: Historical Med  . Order #: [id number] Class: Print  . Order #: [id number] Class: Print  . Order #: [id number] Class: Historical Med    Allergies Patient has no known allergies.  No family history on file.  Social History Social History   Tobacco Use  . Smoking status: Never Smoker  . Smokeless tobacco: Never Used  Substance Use Topics  . Alcohol use: No  . Drug use: No    Review of Systems  All other systems negative except as documented in the HPI. All pertinent positives and negatives as reviewed in the  HPI. ____________________________________________   PHYSICAL EXAM:  VITAL SIGNS: ED Triage Vitals  Enc Vitals Group     BP 10/05/17 0616 (!) 160/89     Pulse Rate 10/05/17 0616 76     Resp 10/05/17 0616 18     Temp 10/05/17 0616 97.8 F (36.6 C)     Temp Source 10/05/17 0616 Oral     SpO2 --      Weight 10/05/17 0616 255 lb (115.7 kg)     Height 10/05/17 0616 5\' 5"  (1.651 m)   Constitutional: Alert and oriented. Well appearing and in no acute distress. Eyes: Conjunctivae are normal. PERRL. EOMI.  Right FTD:DUKGEye:Mild pain with right eye movement. Woods lamp negative for dendritic lesion. Conjunctiva injected. Eye lid swollen. No proptosis, IOP of R eye is 14. Head: Atraumatic. Nose: No congestion/rhinnorhea. Mouth/Throat: Mucous membranes are moist.  Oropharynx non-erythematous. Neck: No stridor.  No meningeal signs.   Cardiovascular: Normal rate, regular rhythm. Good peripheral circulation. Grossly normal heart sounds.   Respiratory: Normal respiratory effort.  No retractions. Lungs CTAB. Gastrointestinal: Soft and nontender. No distention.  Musculoskeletal: No lower extremity tenderness nor edema. No gross deformities of extremities. Neurologic:  Normal speech and language. No gross focal neurologic deficits are appreciated.  Skin:  Skin is warm, dry and intact. Has vesicular rash in right temporal/zygomatic area that abuts her right eye and lesions on  temporal side of her right nose as well.    ____________________________________________   LABS (all labs ordered are listed, but only abnormal results are displayed)  Labs Reviewed - No data to display ____________________________________________  PROCEDURES  Procedure(s) performed:   Procedures   ____________________________________________   INITIAL IMPRESSION / ASSESSMENT AND PLAN / ED COURSE  Pertinent labs & imaging results that were available during my care of the patient were reviewed by me and considered in my  medical decision making (see chart for details).  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 Pain meds/neurontin for pain control.  Discussed with ophthalmology and he feels like this is more consistent with viral shedding however agrees to see the patient in the office this week.  States 2-3 days is appropriate with just lubricating drops.  If anything worsens then she may need to be seen earlier.  Patient updated and informed and is agreeable with the plan.  Pain medication provided for short course will need to follow-up with her primary doctor for more pain medicine if needed. ____________________________________________  FINAL CLINICAL IMPRESSION(S) / ED DIAGNOSES  Final diagnoses:  Herpes zoster without complication  Herpes zoster ophthalmicus     MEDICATIONS GIVEN DURING THIS VISIT:  Medications  oxyCODONE-acetaminophen (PERCOCET/ROXICET) 5-325 MG per tablet 2 tablet (not administered)  gabapentin (NEURONTIN) capsule 100 mg (not administered)  naphazoline-glycerin (CLEAR EYES) ophth solution 1-2 drop (not administered)  fluorescein ophthalmic strip 1 strip (1 strip Right Eye Given by Other 10/05/17 0748)  tetracaine (PONTOCAINE) 0.5 % ophthalmic solution 2 drop (2 drops Both Eyes Given by Other 10/05/17 0748)     NEW OUTPATIENT MEDICATIONS STARTED DURING THIS VISIT:  This SmartLink is deprecated. Use AVSMEDLIST instead to display the medication list for a patient.  Note:  This document was prepared using Dragon voice recognition software and may include unintentional dictation errors.   Marily MemosMesner, Raunel Dimartino, MD 10/05/17 [phone]

## 2017-10-05 NOTE — ED Triage Notes (Signed)
Pt reports diagnosed with shingles Thursday.  Blisters present to r side of face.  C/O pain to r side of face and neck.

## 2017-10-05 NOTE — Discharge Instructions (Signed)
Please schedule follow-up with ophthalmologist in 2-3 days or anytime this week.  If vision changes or eye symptoms get worse please see him earlier.  Any other new symptoms please return to the emergency department.  As far as pain control please follow-up with your primary doctor for further pain control if needed beyond medications given in the emergency department.

## 2017-10-05 NOTE — ED Notes (Signed)
Paged a second time through paging service. Kristen Briggs

## 2017-10-07 ENCOUNTER — Encounter (HOSPITAL_COMMUNITY): Payer: Self-pay | Admitting: Emergency Medicine

## 2017-10-07 ENCOUNTER — Emergency Department (HOSPITAL_COMMUNITY)
Admission: EM | Admit: 2017-10-07 | Discharge: 2017-10-07 | Disposition: A | Discharge status: Home / Self Care | Payer: Self-pay | Attending: Emergency Medicine | Admitting: Emergency Medicine

## 2017-10-07 ENCOUNTER — Other Ambulatory Visit: Payer: Self-pay

## 2017-10-07 DIAGNOSIS — R110 Nausea: Secondary | ICD-10-CM | POA: Insufficient documentation

## 2017-10-07 DIAGNOSIS — Z7984 Long term (current) use of oral hypoglycemic drugs: Secondary | ICD-10-CM | POA: Insufficient documentation

## 2017-10-07 DIAGNOSIS — E860 Dehydration: Secondary | ICD-10-CM | POA: Insufficient documentation

## 2017-10-07 DIAGNOSIS — Z79899 Other long term (current) drug therapy: Secondary | ICD-10-CM | POA: Insufficient documentation

## 2017-10-07 DIAGNOSIS — E119 Type 2 diabetes mellitus without complications: Secondary | ICD-10-CM | POA: Insufficient documentation

## 2017-10-07 DIAGNOSIS — B029 Zoster without complications: Secondary | ICD-10-CM | POA: Insufficient documentation

## 2017-10-07 DIAGNOSIS — I10 Essential (primary) hypertension: Secondary | ICD-10-CM | POA: Insufficient documentation

## 2017-10-07 DIAGNOSIS — Z7982 Long term (current) use of aspirin: Secondary | ICD-10-CM | POA: Insufficient documentation

## 2017-10-07 LAB — COMPREHENSIVE METABOLIC PANEL WITH GFR
ALT: 11 U/L — ABNORMAL LOW (ref 14–54)
AST: 10 U/L — ABNORMAL LOW (ref 15–41)
Albumin: 3.2 g/dL — ABNORMAL LOW (ref 3.5–5.0)
Alkaline Phosphatase: 112 U/L (ref 38–126)
Anion gap: 8 (ref 5–15)
BUN: 9 mg/dL (ref 6–20)
CO2: 28 mmol/L (ref 22–32)
Calcium: 8.5 mg/dL — ABNORMAL LOW (ref 8.9–10.3)
Chloride: 96 mmol/L — ABNORMAL LOW (ref 101–111)
Creatinine, Ser: 0.84 mg/dL (ref 0.44–1.00)
GFR calc Af Amer: >60 mL/min
GFR calc non Af Amer: >60 mL/min
Glucose, Bld: 301 mg/dL — ABNORMAL HIGH (ref 65–99)
Potassium: 3.7 mmol/L (ref 3.5–5.1)
Sodium: 132 mmol/L — ABNORMAL LOW (ref 135–145)
Total Bilirubin: 1.2 mg/dL (ref 0.3–1.2)
Total Protein: 7.8 g/dL (ref 6.5–8.1)

## 2017-10-07 LAB — CBC
HCT: 42.9 % (ref 36.0–46.0)
HEMOGLOBIN: 15.1 g/dL — AB (ref 12.0–15.0)
MCH: 28.5 pg (ref 26.0–34.0)
MCHC: 35.2 g/dL (ref 30.0–36.0)
MCV: 80.9 fL (ref 78.0–100.0)
Platelets: 345 10*3/uL (ref 150–400)
RBC: 5.3 MIL/uL — AB (ref 3.87–5.11)
RDW: 12.4 % (ref 11.5–15.5)
WBC: 17.1 10*3/uL — ABNORMAL HIGH (ref 4.0–10.5)

## 2017-10-07 LAB — LIPASE, BLOOD: Lipase: 28 U/L (ref 11–51)

## 2017-10-07 MED ORDER — VALACYCLOVIR HCL 1 G PO TABS: 1000.0000 mg | ORAL_TABLET | Freq: Three times a day (TID) | ORAL | 0 refills | Status: DC

## 2017-10-07 MED ORDER — ACYCLOVIR 5 % EX OINT: 1.0000 "application " | TOPICAL_OINTMENT | CUTANEOUS | 0 refills | Status: DC

## 2017-10-07 MED ORDER — SODIUM CHLORIDE 0.9 % IV BOLUS (SEPSIS)
1000.0000 mL | Freq: Once | INTRAVENOUS | Status: AC
  Administered 2017-10-07: 1000 mL via INTRAVENOUS

## 2017-10-07 MED ORDER — LINAGLIPTIN 5 MG PO TABS
5.0000 mg | ORAL_TABLET | Freq: Every day | ORAL | Status: DC
  Filled 2017-10-07: qty 1

## 2017-10-07 MED ORDER — MORPHINE SULFATE (PF) 4 MG/ML IV SOLN
4.0000 mg | Freq: Once | INTRAVENOUS | Status: AC
  Administered 2017-10-07: 4 mg via INTRAVENOUS
  Filled 2017-10-07: qty 1

## 2017-10-07 MED ORDER — ONDANSETRON 4 MG PO TBDP
4.0000 mg | ORAL_TABLET | Freq: Once | ORAL | Status: AC | PRN
  Administered 2017-10-07: 4 mg via ORAL
  Filled 2017-10-07: qty 1

## 2017-10-07 MED ORDER — ONDANSETRON 4 MG PO TBDP: 4.0000 mg | ORAL_TABLET | Freq: Three times a day (TID) | ORAL | 0 refills | Status: DC | PRN

## 2017-10-07 MED ORDER — HYDROCHLOROTHIAZIDE 25 MG PO TABS
25.0000 mg | ORAL_TABLET | Freq: Every day | ORAL | Status: DC
  Administered 2017-10-07: 25 mg via ORAL
  Filled 2017-10-07: qty 1

## 2017-10-07 MED ORDER — LISINOPRIL 20 MG PO TABS
20.0000 mg | ORAL_TABLET | Freq: Every day | ORAL | Status: DC
  Administered 2017-10-07: 20 mg via ORAL
  Filled 2017-10-07: qty 1

## 2017-10-07 MED ORDER — VALACYCLOVIR HCL 500 MG PO TABS
1000.0000 mg | ORAL_TABLET | Freq: Once | ORAL | Status: AC
  Administered 2017-10-07: 1000 mg via ORAL
  Filled 2017-10-07: qty 2

## 2017-10-07 MED ORDER — PROMETHAZINE HCL 25 MG/ML IJ SOLN
12.5000 mg | Freq: Once | INTRAMUSCULAR | Status: AC
  Administered 2017-10-07: 12.5 mg via INTRAVENOUS
  Filled 2017-10-07: qty 1

## 2017-10-07 MED ORDER — HYDROCODONE-ACETAMINOPHEN 5-325 MG PO TABS: 1.0000 | ORAL_TABLET | ORAL | 0 refills | Status: DC | PRN

## 2017-10-07 MED ORDER — CARVEDILOL 12.5 MG PO TABS
25.0000 mg | ORAL_TABLET | Freq: Two times a day (BID) | ORAL | Status: DC
  Administered 2017-10-07: 25 mg via ORAL
  Filled 2017-10-07: qty 2

## 2017-10-07 NOTE — ED Provider Notes (Signed)
[facility] Provider Note   CSN: [id number] Arrival date & time: 10/07/17  [id number]     History   Chief Complaint Chief Complaint  Patient presents with  . Herpes Zoster  . Nausea  . Emesis    HPI Kristen MossesSherry D Briggs is a 51 y.o. female.  Pt presents to the ED today with shingles to the right face.  The pt initially presented to [facility] ED on 11/8 with a shingles lesion to her right eyebrow.  No evidence of lesions to the eye. The pt was treated with acyclovir.  The pt returned to [facility] ED on 11/11 with worsening of the lesions.  There was a question of possibility of eye involvement.  Eye doctor was called and pt told to f/u with ophtholoology.  The pt has not done so yet.  The pt said she developed n/v last night.  She said the shingles are still not getting better.  She said she has been compliant with the acyclovir.  She denies any pain in her eye.  She denies any blurry vision to her eye.      Past Medical History:  Diagnosis Date  . Diabetes mellitus   . Hypertension     There are no active problems to display for this patient.   Past Surgical History:  Procedure Laterality Date  . CESAREAN SECTION      OB History    No data available       Home Medications    Prior to Admission medications   Medication Sig Start Date End Date Taking? Authorizing Provider  acyclovir (ZOVIRAX) 400 MG tablet Take 2 tablets (800 mg total) 5 (five) times daily for 10 days by mouth. 10/02/17 10/12/17 Yes Vanetta MuldersZackowski, Scott, MD  aspirin EC 81 MG tablet Take 81 mg by mouth daily.   Yes [provider]  carvedilol (COREG) 25 MG tablet Take 25 mg by mouth 2 (two) times daily with a meal.   Yes [provider]  gabapentin (NEURONTIN) 100 MG capsule Take 1 capsule (100 mg total) 3 (three) times daily by mouth. 10/05/17  Yes Mesner, Barbara CowerJason, MD  hydrochlorothiazide (HYDRODIURIL) 25 MG tablet Take 25 mg by mouth daily.   Yes [provider]    insulin detemir (LEVEMIR) 100 UNIT/ML injection Inject 35 Units into the skin at bedtime.    Yes [provider]  lisinopril (PRINIVIL,ZESTRIL) 20 MG tablet Take 20 mg daily by mouth.    Yes [provider]  oxyCODONE-acetaminophen (PERCOCET) 5-325 MG tablet Take 1 tablet every 6 (six) hours as needed by mouth for severe pain. 10/05/17  Yes Mesner, Barbara CowerJason, MD  predniSONE (DELTASONE) 10 MG tablet Take 4 tablets (40 mg total) daily by mouth. 10/02/17  Yes Vanetta MuldersZackowski, Scott, MD  sitaGLIPtin (JANUVIA) 100 MG tablet Take 100 mg by mouth daily.   Yes [provider]  acyclovir ointment (ZOVIRAX) 5 % Apply 1 application every 3 (three) hours topically. 10/07/17   Jacalyn LefevreHaviland, Tavis Kring, MD  doxycycline (VIBRAMYCIN) 100 MG capsule Take 1 capsule (100 mg total) by mouth 2 (two) times daily. Patient not taking: Reported on 10/07/2017 05/16/14   Benjiman CorePickering, Nathan, MD  furosemide (LASIX) 20 MG tablet Take 1 tablet (20 mg total) by mouth daily. Patient not taking: Reported on 10/07/2017 05/16/14   Benjiman CorePickering, Nathan, MD  HYDROcodone-acetaminophen (NORCO/VICODIN) 5-325 MG tablet Take 1 tablet every 4 (four) hours as needed by mouth. 10/07/17   Jacalyn LefevreHaviland, Rajinder Mesick, MD  ondansetron (ZOFRAN ODT) 4  MG disintegrating tablet Take 1 tablet (4 mg total) every 8 (eight) hours as needed by mouth. 10/07/17   Jacalyn Lefevre, MD  valACYclovir (VALTREX) 1000 MG tablet Take 1 tablet (1,000 mg total) 3 (three) times daily by mouth. 10/07/17   Jacalyn Lefevre, MD    Family History History reviewed. No pertinent family history.  Social History Social History   Tobacco Use  . Smoking status: Never Smoker  . Smokeless tobacco: Never Used  Substance Use Topics  . Alcohol use: No  . Drug use: No     Allergies   Patient has no known allergies.   Review of Systems Review of Systems  Gastrointestinal: Positive for nausea and vomiting.  Skin: Positive for rash.  All other systems reviewed and are  negative.    Physical Exam Updated Vital Signs BP (!) 182/100   Pulse 81   Temp 99.4 F (37.4 C) (Oral)   Resp 18   LMP 09/15/2017   SpO2 92%   Physical Exam  Constitutional: She is oriented to person, place, and time. She appears well-developed and well-nourished.  HENT:  Head: Normocephalic.  Left Ear: External ear normal.  Nose: Nose normal.  Mouth/Throat: Oropharynx is clear and moist.  Pt has evidences of herpes zoster to right forehead, right eyelid, right upper face in the V1 dermatome of the trigeminal nerve.  The pt has ot evidence of ramsay hunt syndrome.  No Hutchinson's sign.   Eyes: EOM are normal. Pupils are equal, round, and reactive to light.  Right eye conjunctiva mildly injected.  No evidence of lesions in the eye.  No pain to eye or blurry vision.  Pulmonary/Chest: Effort normal and breath sounds normal.  Abdominal: Soft. Bowel sounds are normal.  Musculoskeletal: Normal range of motion.  Neurological: She is alert and oriented to person, place, and time.  Skin: Skin is warm. Capillary refill takes less than 2 seconds.  Psychiatric: She has a normal mood and affect. Her behavior is normal. Judgment and thought content normal.  Nursing note and vitals reviewed.    ED Treatments / Results  Labs (all labs ordered are listed, but only abnormal results are displayed) Labs Reviewed  COMPREHENSIVE METABOLIC PANEL - Abnormal; Notable for the following components:      Result Value   Sodium 132 (*)    Chloride 96 (*)    Glucose, Bld 301 (*)    Calcium 8.5 (*)    Albumin 3.2 (*)    AST 10 (*)    ALT 11 (*)    All other components within normal limits  CBC - Abnormal; Notable for the following components:   WBC 17.1 (*)    RBC 5.30 (*)    Hemoglobin 15.1 (*)    All other components within normal limits  LIPASE, BLOOD    EKG  EKG Interpretation None       Radiology No results found.  Procedures Procedures (including critical care  time)  Medications Ordered in ED Medications  carvedilol (COREG) tablet 25 mg (25 mg Oral Given 10/07/17 1402)  hydrochlorothiazide (HYDRODIURIL) tablet 25 mg (25 mg Oral Given 10/07/17 1403)  lisinopril (PRINIVIL,ZESTRIL) tablet 20 mg (20 mg Oral Given 10/07/17 1402)  linagliptin (TRADJENTA) tablet 5 mg (not administered)  ondansetron (ZOFRAN-ODT) disintegrating tablet 4 mg (4 mg Oral Given 10/07/17 0953)  sodium chloride 0.9 % bolus 1,000 mL (1,000 mLs Intravenous New Bag/Given 10/07/17 1153)  morphine 4 MG/ML injection 4 mg (4 mg Intravenous Given 10/07/17 1157)  promethazine (  PHENERGAN) injection 12.5 mg (12.5 mg Intravenous Given 10/07/17 1154)  valACYclovir (VALTREX) tablet 1,000 mg (1,000 mg Oral Given 10/07/17 1258)  sodium chloride 0.9 % bolus 1,000 mL (1,000 mLs Intravenous New Bag/Given 10/07/17 1403)     Initial Impression / Assessment and Plan / ED Course  I have reviewed the triage vital signs and the nursing notes.  Pertinent labs & imaging results that were available during my care of the patient were reviewed by me and considered in my medical decision making (see chart for details).    Pt is feeling much better.  She was able to keep down meds as well as water and crackers.  The pt will be switched to Valtrex and acyclovir cream as shingles still persist with new outcroppings.  The pt asked for a note for work which she was given.  The pt instructed to f/u with pcp and return if worse.   Final Clinical Impressions(s) / ED Diagnoses   Final diagnoses:  Herpes zoster without complication  Dehydration  Nausea    ED Discharge Orders        Ordered    valACYclovir (VALTREX) 1000 MG tablet  3 times daily     10/07/17 1419    HYDROcodone-acetaminophen (NORCO/VICODIN) 5-325 MG tablet  Every 4 hours PRN     10/07/17 1419    ondansetron (ZOFRAN ODT) 4 MG disintegrating tablet  Every 8 hours PRN     10/07/17 1419    acyclovir ointment (ZOVIRAX) 5 %  Every  3 hours      10/07/17 1419       Jacalyn LefevreHaviland, Ndrew Creason, MD 10/07/17 1425

## 2017-10-07 NOTE — ED Triage Notes (Signed)
Pt to ER for worsening shingles and new onset nausea, vomiting last night. States has been taking her medications but not having any resolution of the rash. Rash present to right face.

## 2017-10-07 NOTE — ED Notes (Addendum)
1st IV failed attempt in right AC.

## 2018-07-17 ENCOUNTER — Encounter (HOSPITAL_COMMUNITY): Payer: Self-pay | Admitting: *Deleted

## 2018-07-17 ENCOUNTER — Observation Stay (HOSPITAL_COMMUNITY)
Admission: EM | Admit: 2018-07-17 | Discharge: 2018-07-18 | Disposition: A | Discharge status: Home / Self Care | Payer: Self-pay | Attending: Internal Medicine | Admitting: Internal Medicine

## 2018-07-17 ENCOUNTER — Other Ambulatory Visit: Payer: Self-pay

## 2018-07-17 ENCOUNTER — Emergency Department (HOSPITAL_COMMUNITY): Payer: Self-pay

## 2018-07-17 DIAGNOSIS — Z7984 Long term (current) use of oral hypoglycemic drugs: Secondary | ICD-10-CM | POA: Insufficient documentation

## 2018-07-17 DIAGNOSIS — E876 Hypokalemia: Secondary | ICD-10-CM | POA: Insufficient documentation

## 2018-07-17 DIAGNOSIS — S2691XA Contusion of heart, unspecified with or without hemopericardium, initial encounter: Secondary | ICD-10-CM | POA: Diagnosis present

## 2018-07-17 DIAGNOSIS — S2222XA Fracture of body of sternum, initial encounter for closed fracture: Principal | ICD-10-CM | POA: Insufficient documentation

## 2018-07-17 DIAGNOSIS — I10 Essential (primary) hypertension: Secondary | ICD-10-CM | POA: Insufficient documentation

## 2018-07-17 DIAGNOSIS — Y92410 Unspecified street and highway as the place of occurrence of the external cause: Secondary | ICD-10-CM | POA: Insufficient documentation

## 2018-07-17 DIAGNOSIS — Y999 Unspecified external cause status: Secondary | ICD-10-CM | POA: Insufficient documentation

## 2018-07-17 DIAGNOSIS — E119 Type 2 diabetes mellitus without complications: Secondary | ICD-10-CM | POA: Insufficient documentation

## 2018-07-17 DIAGNOSIS — I493 Ventricular premature depolarization: Secondary | ICD-10-CM | POA: Insufficient documentation

## 2018-07-17 DIAGNOSIS — Z7982 Long term (current) use of aspirin: Secondary | ICD-10-CM | POA: Insufficient documentation

## 2018-07-17 DIAGNOSIS — Y9389 Activity, other specified: Secondary | ICD-10-CM | POA: Insufficient documentation

## 2018-07-17 DIAGNOSIS — D72829 Elevated white blood cell count, unspecified: Secondary | ICD-10-CM | POA: Insufficient documentation

## 2018-07-17 LAB — CBG MONITORING, ED: Glucose-Capillary: 237 mg/dL — ABNORMAL HIGH (ref 70–99)

## 2018-07-17 MED ORDER — IBUPROFEN 800 MG PO TABS
800.0000 mg | ORAL_TABLET | Freq: Once | ORAL | Status: AC
  Administered 2018-07-17: 800 mg via ORAL
  Filled 2018-07-17: qty 1

## 2018-07-17 MED ORDER — BENZONATATE 100 MG PO CAPS
200.0000 mg | ORAL_CAPSULE | Freq: Once | ORAL | Status: AC
  Administered 2018-07-18: 200 mg via ORAL
  Filled 2018-07-17: qty 2

## 2018-07-17 NOTE — ED Notes (Signed)
Patient transported to X-ray 
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
## 2018-07-18 ENCOUNTER — Other Ambulatory Visit: Payer: Self-pay

## 2018-07-18 DIAGNOSIS — S2222XA Fracture of body of sternum, initial encounter for closed fracture: Secondary | ICD-10-CM

## 2018-07-18 DIAGNOSIS — S2691XA Contusion of heart, unspecified with or without hemopericardium, initial encounter: Secondary | ICD-10-CM | POA: Diagnosis present

## 2018-07-18 DIAGNOSIS — S2691XS Contusion of heart, unspecified with or without hemopericardium, sequela: Secondary | ICD-10-CM

## 2018-07-18 DIAGNOSIS — I493 Ventricular premature depolarization: Secondary | ICD-10-CM

## 2018-07-18 LAB — CBC
HCT: 34.6 % — ABNORMAL LOW (ref 36.0–46.0)
HEMOGLOBIN: 11.7 g/dL — AB (ref 12.0–15.0)
MCH: 27.6 pg (ref 26.0–34.0)
MCHC: 33.8 g/dL (ref 30.0–36.0)
MCV: 81.6 fL (ref 78.0–100.0)
Platelets: 357 10*3/uL (ref 150–400)
RBC: 4.24 MIL/uL (ref 3.87–5.11)
RDW: 12 % (ref 11.5–15.5)
WBC: 17.9 10*3/uL — ABNORMAL HIGH (ref 4.0–10.5)

## 2018-07-18 LAB — BASIC METABOLIC PANEL
ANION GAP: 8 (ref 5–15)
BUN: 13 mg/dL (ref 6–20)
CO2: 27 mmol/L (ref 22–32)
Calcium: 8.5 mg/dL — ABNORMAL LOW (ref 8.9–10.3)
Chloride: 98 mmol/L (ref 98–111)
Creatinine, Ser: 0.79 mg/dL (ref 0.44–1.00)
GLUCOSE: 246 mg/dL — AB (ref 70–99)
Potassium: 3.4 mmol/L — ABNORMAL LOW (ref 3.5–5.1)
Sodium: 133 mmol/L — ABNORMAL LOW (ref 135–145)

## 2018-07-18 LAB — CBC WITH DIFFERENTIAL/PLATELET
BASOS ABS: 0.1 10*3/uL (ref 0.0–0.1)
Basophils Relative: 0 %
EOS PCT: 0 %
Eosinophils Absolute: 0.1 10*3/uL (ref 0.0–0.7)
HCT: 36.4 % (ref 36.0–46.0)
Hemoglobin: 12.5 g/dL (ref 12.0–15.0)
LYMPHS PCT: 22 %
Lymphs Abs: 4.8 10*3/uL — ABNORMAL HIGH (ref 0.7–4.0)
MCH: 27.8 pg (ref 26.0–34.0)
MCHC: 34.3 g/dL (ref 30.0–36.0)
MCV: 80.9 fL (ref 78.0–100.0)
Monocytes Absolute: 1.1 10*3/uL — ABNORMAL HIGH (ref 0.1–1.0)
Monocytes Relative: 5 %
NEUTROS ABS: 15.9 10*3/uL — AB (ref 1.7–7.7)
Neutrophils Relative %: 73 %
PLATELETS: 360 10*3/uL (ref 150–400)
RBC: 4.5 MIL/uL (ref 3.87–5.11)
RDW: 12 % (ref 11.5–15.5)
WBC: 21.9 10*3/uL — AB (ref 4.0–10.5)

## 2018-07-18 LAB — COMPREHENSIVE METABOLIC PANEL
ALK PHOS: 101 U/L (ref 38–126)
ALT: 10 U/L (ref 0–44)
AST: 14 U/L — ABNORMAL LOW (ref 15–41)
Albumin: 2.9 g/dL — ABNORMAL LOW (ref 3.5–5.0)
Anion gap: 5 (ref 5–15)
BUN: 14 mg/dL (ref 6–20)
CALCIUM: 8.3 mg/dL — AB (ref 8.9–10.3)
CO2: 29 mmol/L (ref 22–32)
CREATININE: 0.81 mg/dL (ref 0.44–1.00)
Chloride: 100 mmol/L (ref 98–111)
GFR calc non Af Amer: >60 mL/min (ref >60)
GLUCOSE: 297 mg/dL — AB (ref 70–99)
Potassium: 3.4 mmol/L — ABNORMAL LOW (ref 3.5–5.1)
SODIUM: 134 mmol/L — AB (ref 135–145)
Total Bilirubin: 0.8 mg/dL (ref 0.3–1.2)
Total Protein: 6.7 g/dL (ref 6.5–8.1)

## 2018-07-18 LAB — TROPONIN I
Troponin I: <0.03 ng/mL (ref <0.03)
Troponin I: <0.03 ng/mL (ref <0.03)
Troponin I: <0.03 ng/mL (ref <0.03)

## 2018-07-18 LAB — HEMOGLOBIN A1C
HEMOGLOBIN A1C: 9 % — AB (ref 4.8–5.6)
Mean Plasma Glucose: 211.6 mg/dL

## 2018-07-18 LAB — GLUCOSE, CAPILLARY
Glucose-Capillary: 241 mg/dL — ABNORMAL HIGH (ref 70–99)
Glucose-Capillary: 211 mg/dL — ABNORMAL HIGH (ref 70–99)

## 2018-07-18 MED ORDER — ONDANSETRON HCL 4 MG/2ML IJ SOLN: 4.0000 mg | Freq: Four times a day (QID) | INTRAMUSCULAR | Status: DC | PRN

## 2018-07-18 MED ORDER — INSULIN ASPART 100 UNIT/ML ~~LOC~~ SOLN
0.0000 [IU] | Freq: Three times a day (TID) | SUBCUTANEOUS | Status: DC
  Administered 2018-07-18 (×2): 3 [IU] via SUBCUTANEOUS

## 2018-07-18 MED ORDER — FAMOTIDINE 20 MG PO TABS: 20.0000 mg | ORAL_TABLET | Freq: Every day | ORAL | 0 refills | Status: AC

## 2018-07-18 MED ORDER — ONDANSETRON HCL 4 MG PO TABS: 4.0000 mg | ORAL_TABLET | Freq: Four times a day (QID) | ORAL | Status: DC | PRN

## 2018-07-18 MED ORDER — POTASSIUM CHLORIDE CRYS ER 20 MEQ PO TBCR
20.0000 meq | EXTENDED_RELEASE_TABLET | Freq: Once | ORAL | Status: AC
  Administered 2018-07-18: 20 meq via ORAL
  Filled 2018-07-18: qty 1

## 2018-07-18 MED ORDER — INSULIN DETEMIR 100 UNIT/ML ~~LOC~~ SOLN
55.0000 [IU] | Freq: Every day | SUBCUTANEOUS | Status: DC
  Filled 2018-07-18 (×2): qty 0.55

## 2018-07-18 MED ORDER — IBUPROFEN 400 MG PO TABS: 400.0000 mg | ORAL_TABLET | Freq: Four times a day (QID) | ORAL | Status: DC | PRN

## 2018-07-18 MED ORDER — ASPIRIN EC 81 MG PO TBEC
81.0000 mg | DELAYED_RELEASE_TABLET | Freq: Every day | ORAL | Status: DC
  Administered 2018-07-18: 81 mg via ORAL
  Filled 2018-07-18: qty 1

## 2018-07-18 MED ORDER — HYDROCHLOROTHIAZIDE 25 MG PO TABS
25.0000 mg | ORAL_TABLET | Freq: Every day | ORAL | Status: DC
  Administered 2018-07-18: 25 mg via ORAL
  Filled 2018-07-18: qty 1

## 2018-07-18 MED ORDER — ENOXAPARIN SODIUM 40 MG/0.4ML ~~LOC~~ SOLN
40.0000 mg | SUBCUTANEOUS | Status: DC
  Filled 2018-07-18: qty 0.4

## 2018-07-18 MED ORDER — IBUPROFEN 400 MG PO TABS: 400.0000 mg | ORAL_TABLET | Freq: Four times a day (QID) | ORAL | 0 refills | Status: AC | PRN

## 2018-07-18 MED ORDER — LISINOPRIL 10 MG PO TABS
20.0000 mg | ORAL_TABLET | Freq: Every day | ORAL | Status: DC
  Administered 2018-07-18: 20 mg via ORAL
  Filled 2018-07-18: qty 2

## 2018-07-18 MED ORDER — OXYCODONE-ACETAMINOPHEN 5-325 MG PO TABS
1.0000 | ORAL_TABLET | ORAL | Status: DC | PRN
  Administered 2018-07-18 (×2): 1 via ORAL
  Filled 2018-07-18 (×2): qty 1

## 2018-07-18 MED ORDER — GABAPENTIN 300 MG PO CAPS
300.0000 mg | ORAL_CAPSULE | Freq: Three times a day (TID) | ORAL | Status: DC
  Administered 2018-07-18: 300 mg via ORAL
  Filled 2018-07-18: qty 1

## 2018-07-18 MED ORDER — FAMOTIDINE 20 MG PO TABS: 20.0000 mg | ORAL_TABLET | Freq: Every day | ORAL | Status: DC
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
## 2018-07-19 LAB — HIV ANTIBODY (ROUTINE TESTING W REFLEX): HIV Screen 4th Generation wRfx: NONREACTIVE
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
## 2020-05-07 ENCOUNTER — Emergency Department (HOSPITAL_COMMUNITY)
Admission: EM | Admit: 2020-05-07 | Discharge: 2020-05-07 | Disposition: A | Discharge status: Home / Self Care | Payer: Self-pay | Attending: Emergency Medicine | Admitting: Emergency Medicine

## 2020-05-07 ENCOUNTER — Other Ambulatory Visit: Payer: Self-pay

## 2020-05-07 ENCOUNTER — Encounter (HOSPITAL_COMMUNITY): Payer: Self-pay | Admitting: *Deleted

## 2020-05-07 DIAGNOSIS — S0502XA Injury of conjunctiva and corneal abrasion without foreign body, left eye, initial encounter: Secondary | ICD-10-CM | POA: Insufficient documentation

## 2020-05-07 DIAGNOSIS — Y849 Medical procedure, unspecified as the cause of abnormal reaction of the patient, or of later complication, without mention of misadventure at the time of the procedure: Secondary | ICD-10-CM | POA: Insufficient documentation

## 2020-05-07 DIAGNOSIS — Y92009 Unspecified place in unspecified non-institutional (private) residence as the place of occurrence of the external cause: Secondary | ICD-10-CM | POA: Insufficient documentation

## 2020-05-07 DIAGNOSIS — Y939 Activity, unspecified: Secondary | ICD-10-CM | POA: Insufficient documentation

## 2020-05-07 DIAGNOSIS — Y999 Unspecified external cause status: Secondary | ICD-10-CM | POA: Insufficient documentation

## 2020-05-07 MED ORDER — FLUORESCEIN SODIUM 1 MG OP STRP
1.0000 | ORAL_STRIP | Freq: Once | OPHTHALMIC | Status: AC
  Administered 2020-05-07: 1 via OPHTHALMIC
  Filled 2020-05-07: qty 1

## 2020-05-07 MED ORDER — TETRACAINE HCL 0.5 % OP SOLN
1.0000 [drp] | Freq: Once | OPHTHALMIC | Status: AC
  Administered 2020-05-07: 1 [drp] via OPHTHALMIC
  Filled 2020-05-07: qty 4

## 2020-05-07 MED ORDER — TOBRAMYCIN 0.3 % OP SOLN
1.0000 [drp] | OPHTHALMIC | Status: DC
  Administered 2020-05-07: 1 [drp] via OPHTHALMIC
  Filled 2020-05-07: qty 5

## 2020-05-07 NOTE — Discharge Instructions (Addendum)
Apply 1 drop of the antibiotic to your left eye every 4 hours while awake for the next 5 days.  Avoid rubbing your eye.  Cool compresses may help with the eyelid swelling.

## 2020-05-07 NOTE — ED Triage Notes (Signed)
Patient presents to the ED with left eye drainage, swelling and discomfort since this morning.  Patient also reports nasal drainage but no fever.

## 2020-05-08 NOTE — ED Provider Notes (Signed)
[facility] Provider Note   CSN: [id number] Arrival date & time: 05/07/20  1612     History Chief Complaint  Patient presents with  . Eye Drainage    left    Kristen Briggs is a 54 y.o. female presenting with left eye discomfort described as a foreign body under her upper eyelid along with increased clear tearing and clear nasal discharge which she woke with this am.  She denies obvious injury, does not wear contacts, but wears glasses. Slept in front of a portable fan last night, went to sleep without any eye complaints.  Mild light sensitivity.  No tx prior to arrival.   The history is provided by the patient.       Past Medical History:  Diagnosis Date  . Diabetes mellitus   . Hypertension     Patient Active Problem List   Diagnosis Date Noted  . Cardiac contusion 07/18/2018    Past Surgical History:  Procedure Laterality Date  . CESAREAN SECTION       OB History   No obstetric history on file.     History reviewed. No pertinent family history.  Social History   Tobacco Use  . Smoking status: Never Smoker  . Smokeless tobacco: Never Used  Substance Use Topics  . Alcohol use: No  . Drug use: No    Home Medications Prior to Admission medications   Medication Sig Start Date End Date Taking? Authorizing Provider  AMLODIPINE BESYLATE PO Take 1 tablet by mouth daily.    [provider]  aspirin EC 81 MG tablet Take 81 mg by mouth daily.    [provider]  famotidine (PEPCID) 20 MG tablet Take 1 tablet (20 mg total) by mouth daily for 20 days. 07/18/18 08/07/18  Arrien, Jimmy Picket, MD  gabapentin (NEURONTIN) 300 MG capsule Take 300 mg by mouth 3 (three) times daily.    [provider]  hydrochlorothiazide (HYDRODIURIL) 25 MG tablet Take 25 mg by mouth daily.    [provider]  ibuprofen (ADVIL,MOTRIN) 400 MG tablet Take 1 tablet (400 mg total) by mouth every 6 (six) hours as needed (chest pain.).  07/18/18   Arrien, Jimmy Picket, MD  Insulin Detemir (LEVEMIR FLEXTOUCH) 100 UNIT/ML Pen Inject 55 Units into the skin at bedtime.    [provider]  lisinopril (PRINIVIL,ZESTRIL) 20 MG tablet Take 20 mg daily by mouth.     [provider]  NOVOLOG 100 UNIT/ML injection Inject 10-25 Units into the skin 3 (three) times daily. Based on sliding scale 06/27/18   [provider]  Red Yeast Rice Extract (RED YEAST RICE PO) Take 1 capsule by mouth daily.    [provider]  sitaGLIPtin (JANUVIA) 100 MG tablet Take 100 mg by mouth daily.    [provider]    Allergies    Patient has no known allergies.  Review of Systems   Review of Systems  Constitutional: Negative for chills and fever.  HENT: Positive for rhinorrhea. Negative for congestion, ear pain, sinus pressure and sore throat.   Eyes: Positive for photophobia, pain and redness. Negative for discharge and visual disturbance.  Respiratory: Negative.   Cardiovascular: Negative.   All other systems reviewed and are negative.   Physical Exam Updated Vital Signs BP (!) 168/81 (BP Location: Right Arm)   Pulse 88   Temp 98.5 F (36.9 C) (Oral)   Resp 14   Ht 5\' 6"  (1.676 m)   Wt  127 kg   LMP 07/06/2018   SpO2 98%   BMI 45.19 kg/m   Physical Exam Vitals and nursing note reviewed.  Constitutional:      Appearance: She is well-developed.  HENT:     Head: Normocephalic and atraumatic.     Nose: Mucosal edema and rhinorrhea present.     Comments: No sinus ttp.    Mouth/Throat:     Pharynx: Uvula midline. No oropharyngeal exudate or posterior oropharyngeal erythema.     Tonsils: No tonsillar abscesses.  Eyes:     General: Lids are everted, no foreign bodies appreciated.        Left eye: Discharge present.No foreign body.     Extraocular Movements: Extraocular movements intact.     Conjunctiva/sclera:     Left eye: Left conjunctiva is injected.     Pupils: Pupils are equal, round,  and reactive to light.     Left eye: Corneal abrasion and fluorescein uptake present.     Slit lamp exam:    Left eye: No corneal ulcer or foreign body.     Comments: Left eye with clear tearing. Mild conjunctival injection without chemosis.  Upper lid is modestly edematous. No lid or periocular erythema. Lids swept with cotton swab, no fb appreciated.  Punctate dye uptake 1 o'clock position cornea, no retained fb.   Tetracaine applied prior to exam. Complete resolution of pain with this tx.   Cardiovascular:     Rate and Rhythm: Normal rate.     Heart sounds: Normal heart sounds.  Pulmonary:     Effort: Pulmonary effort is normal. No respiratory distress.     Breath sounds: No wheezing or rales.  Abdominal:     Palpations: Abdomen is soft.     Tenderness: There is no abdominal tenderness.  Musculoskeletal:        General: Normal range of motion.  Skin:    General: Skin is warm and dry.     Findings: No rash.  Neurological:     Mental Status: She is alert and oriented to person, place, and time.     ED Results / Procedures / Treatments   Labs (all labs ordered are listed, but only abnormal results are displayed) Labs Reviewed - No data to display  EKG None  Radiology No results found.  Procedures Procedures (including critical care time)  Medications Ordered in ED Medications  fluorescein ophthalmic strip 1 strip (1 strip Left Eye Given 05/07/20 1918)  tetracaine (PONTOCAINE) 0.5 % ophthalmic solution 1 drop (1 drop Left Eye Given 05/07/20 1918)    ED Course  I have reviewed the triage vital signs and the nursing notes.  Pertinent labs & imaging results that were available during my care of the patient were reviewed by me and considered in my medical decision making (see chart for details).    MDM Rules/Calculators/A&P                          Small corneal abrasion, suspect from irritation/dry eye from sleeping in front of fan, upper lid edema, suspect pt was  rubbing eye in sleep, cannot rule out fb as initial source, but no fb found on exam today. She was given tobrex , advised to avoid rubbing eye, instead cool compress for irritation.  Has ophthalmologist - recheck with this MD if not improved over the next 2-3 days. No signs of infected globe or periorbital cellulitis.   Discussed tetanus update.  Pt  defers. Will check with pcp re need for vaccine.  Final Clinical Impression(s) / ED Diagnoses Final diagnoses:  Abrasion of left cornea, initial encounter    Rx / DC Orders ED Discharge Orders    None       Victoriano Lain 05/08/20 2231    Vanetta Mulders, MD 05/11/20 [phone]

## 2020-10-28 ENCOUNTER — Other Ambulatory Visit: Payer: Self-pay

## 2020-10-28 ENCOUNTER — Encounter (HOSPITAL_COMMUNITY): Payer: Self-pay | Admitting: Emergency Medicine

## 2020-10-28 ENCOUNTER — Emergency Department (HOSPITAL_COMMUNITY)
Admission: EM | Admit: 2020-10-28 | Discharge: 2020-10-28 | Disposition: A | Discharge status: Home / Self Care | Payer: Self-pay | Attending: Emergency Medicine | Admitting: Emergency Medicine

## 2020-10-28 DIAGNOSIS — G510 Bell's palsy: Secondary | ICD-10-CM | POA: Insufficient documentation

## 2020-10-28 DIAGNOSIS — I10 Essential (primary) hypertension: Secondary | ICD-10-CM | POA: Insufficient documentation

## 2020-10-28 DIAGNOSIS — E119 Type 2 diabetes mellitus without complications: Secondary | ICD-10-CM | POA: Insufficient documentation

## 2020-10-28 DIAGNOSIS — Z7982 Long term (current) use of aspirin: Secondary | ICD-10-CM | POA: Insufficient documentation

## 2020-10-28 DIAGNOSIS — Z794 Long term (current) use of insulin: Secondary | ICD-10-CM | POA: Insufficient documentation

## 2020-10-28 DIAGNOSIS — Z79899 Other long term (current) drug therapy: Secondary | ICD-10-CM | POA: Insufficient documentation

## 2020-10-28 LAB — CBG MONITORING, ED: Glucose-Capillary: 239 mg/dL — ABNORMAL HIGH (ref 70–99)

## 2020-10-28 MED ORDER — PREDNISONE 20 MG PO TABS: 40.0000 mg | ORAL_TABLET | Freq: Every day | ORAL | 0 refills | Status: AC

## 2020-10-28 MED ORDER — ACYCLOVIR 400 MG PO TABS: 800.0000 mg | ORAL_TABLET | Freq: Every day | ORAL | 0 refills | Status: AC

## 2020-10-28 NOTE — ED Triage Notes (Signed)
Pt c/o of right sided facial numbness since yesterday at 1030.

## 2020-10-28 NOTE — ED Provider Notes (Signed)
[facility] EMERGENCY DEPARTMENT Provider Note   CSN: [id number] Arrival date & time: 10/28/20  1117     History Chief Complaint  Patient presents with  . Numbness    Kristen Briggs is a 54 y.o. female.  HPI   This patient is a 54 year old female, she has a known history of diabetes but takes her medications as prescribed.  She states that yesterday she noticed in the middle of the day that she was having some difficulty with the right side of her face, even noting that when she would try to drink half of the liquid would end up on her shirt and half in her mouth.  She noticed that she was having some bleeding of her eye and some dry eye on the right, she had no problems with using her arms or legs, no problems with speech or vision.  No problems with balance or gait.  The symptoms are persistent, they seem more dense this morning, she also felt like the side of her tongue was numb.  She has never had anything like this before, she has had shingles in the past involving the right side of her face but does not have a rash on her face today  Past Medical History:  Diagnosis Date  . Diabetes mellitus   . Hypertension     Patient Active Problem List   Diagnosis Date Noted  . Cardiac contusion 07/18/2018    Past Surgical History:  Procedure Laterality Date  . CESAREAN SECTION       OB History   No obstetric history on file.     History reviewed. No pertinent family history.  Social History   Tobacco Use  . Smoking status: Never Smoker  . Smokeless tobacco: Never Used  Substance Use Topics  . Alcohol use: No  . Drug use: No    Home Medications Prior to Admission medications   Medication Sig Start Date End Date Taking? Authorizing Provider  acyclovir (ZOVIRAX) 400 MG tablet Take 2 tablets (800 mg total) by mouth 5 (five) times daily. 10/28/20   Eber Hong, MD  AMLODIPINE BESYLATE PO Take 1 tablet by mouth daily.    [provider]  aspirin EC 81 MG tablet  Take 81 mg by mouth daily.    [provider]  famotidine (PEPCID) 20 MG tablet Take 1 tablet (20 mg total) by mouth daily for 20 days. 07/18/18 08/07/18  Arrien, York Ram, MD  gabapentin (NEURONTIN) 300 MG capsule Take 300 mg by mouth 3 (three) times daily.    [provider]  hydrochlorothiazide (HYDRODIURIL) 25 MG tablet Take 25 mg by mouth daily.    [provider]  ibuprofen (ADVIL,MOTRIN) 400 MG tablet Take 1 tablet (400 mg total) by mouth every 6 (six) hours as needed (chest pain.). 07/18/18   Arrien, York Ram, MD  Insulin Detemir (LEVEMIR FLEXTOUCH) 100 UNIT/ML Pen Inject 55 Units into the skin at bedtime.    [provider]  lisinopril (PRINIVIL,ZESTRIL) 20 MG tablet Take 20 mg daily by mouth.     [provider]  NOVOLOG 100 UNIT/ML injection Inject 10-25 Units into the skin 3 (three) times daily. Based on sliding scale 06/27/18   [provider]  predniSONE (DELTASONE) 20 MG tablet Take 2 tablets (40 mg total) by mouth daily for 7 days. 10/28/20 11/04/20  Eber Hong, MD  Red Yeast Rice Extract (RED YEAST RICE PO) Take 1 capsule by mouth daily.    [provider]  sitaGLIPtin (JANUVIA) 100 MG tablet Take 100 mg by mouth daily.    [provider]    Allergies    Patient has no known allergies.  Review of Systems   Review of Systems  All other systems reviewed and are negative.   Physical Exam Updated Vital Signs BP (!) 177/90   Pulse 91   Temp 97.7 F (36.5 C) (Oral)   Resp 19   Ht 1.676 m (5\' 6" )   Wt 124.7 kg   LMP 07/06/2018   SpO2 100%   BMI 44.39 kg/m   Physical Exam Vitals and nursing note reviewed.  Constitutional:      General: She is not in acute distress.    Appearance: She is well-developed.  HENT:     Head: Normocephalic and atraumatic.     Mouth/Throat:     Pharynx: No oropharyngeal exudate.  Eyes:     General: No scleral icterus.       Right eye: No discharge.         Left eye: No discharge.     Conjunctiva/sclera: Conjunctivae normal.     Pupils: Pupils are equal, round, and reactive to light.  Neck:     Thyroid: No thyromegaly.     Vascular: No JVD.  Cardiovascular:     Rate and Rhythm: Normal rate and regular rhythm.     Heart sounds: Normal heart sounds. No murmur heard.  No friction rub. No gallop.   Pulmonary:     Effort: Pulmonary effort is normal. No respiratory distress.     Breath sounds: Normal breath sounds. No wheezing or rales.  Abdominal:     General: Bowel sounds are normal. There is no distension.     Palpations: Abdomen is soft. There is no mass.     Tenderness: There is no abdominal tenderness.  Musculoskeletal:        General: No tenderness. Normal range of motion.     Cervical back: Normal range of motion and neck supple.  Lymphadenopathy:     Cervical: No cervical adenopathy.  Skin:    General: Skin is warm and dry.     Findings: No erythema or rash.  Neurological:     Mental Status: She is alert.     Coordination: Coordination normal.     Comments: Speech is clear, cranial nerves III through XII are intact, memory is intact, strength is normal in all 4 extremities including grips, sensation is intact to light touch and pinprick in all 4 extremities. Coordination as tested by finger-nose-finger is normal, no limb ataxia. Normal gait, normal reflexes at the patellar tendons bilaterally  Psychiatric:        Behavior: Behavior normal.     ED Results / Procedures / Treatments   Labs (all labs ordered are listed, but only abnormal results are displayed) Labs Reviewed  CBG MONITORING, ED - Abnormal; Notable for the following components:      Result Value   Glucose-Capillary 239 (*)    All other components within normal limits    EKG None  Radiology No results found.  Procedures Procedures (including critical care time)  Medications Ordered in ED Medications - No data to display  ED Course  I have reviewed  the triage vital signs and the nursing notes.  Pertinent labs & imaging results that were available during my care of the patient were reviewed by me and considered in my medical decision making (see chart for details).    MDM Rules/Calculators/A&P  No rash, no tick bite, will treat for Bell's palsy with both prednisone and acyclovir especially given her history of herpetic infection.  At this time the patient's glucose is 239, she endorses drinking orange juice this morning.  She has been counseled on a strict diabetic diet given that she will be on prednisone.  She is agreeable, well-appearing, doubt stroke, patient stable for discharge and aware of the indications for return.  Final Clinical Impression(s) / ED Diagnoses Final diagnoses:  Bell palsy    Rx / DC Orders ED Discharge Orders         Ordered    predniSONE (DELTASONE) 20 MG tablet  Daily        10/28/20 1133    acyclovir (ZOVIRAX) 400 MG tablet  5 times daily        10/28/20 1133           Eber Hong, MD 10/28/20 1136

## 2020-10-28 NOTE — Discharge Instructions (Signed)
Please be aware that taking these medications may cause your blood sugar to rise, please make sure you are following a strict diabetic diet and taking your medications exactly as prescribed.  Many people will have symptoms for several weeks if not a couple of months however this should gradually improve over time.  You should follow-up with your doctor in 1 week for a recheck, return to the emergency department for any severe or worsening symptoms including changes in vision difficulty using your arms or legs, difficulty with speech or balance.  Please use topical artificial teardrops for your eye to prevent excessive blinking and dryness

## 2024-04-14 ENCOUNTER — Emergency Department (HOSPITAL_COMMUNITY)

## 2024-04-14 ENCOUNTER — Emergency Department (HOSPITAL_COMMUNITY)
Admission: EM | Admit: 2024-04-14 | Discharge: 2024-04-14 | Disposition: A | Discharge status: Home / Self Care | Attending: Emergency Medicine | Admitting: Emergency Medicine

## 2024-04-14 ENCOUNTER — Other Ambulatory Visit: Payer: Self-pay

## 2024-04-14 ENCOUNTER — Encounter (HOSPITAL_COMMUNITY): Payer: Self-pay | Admitting: *Deleted

## 2024-04-14 DIAGNOSIS — I10 Essential (primary) hypertension: Secondary | ICD-10-CM | POA: Insufficient documentation

## 2024-04-14 DIAGNOSIS — Z79899 Other long term (current) drug therapy: Secondary | ICD-10-CM | POA: Diagnosis not present

## 2024-04-14 DIAGNOSIS — E119 Type 2 diabetes mellitus without complications: Secondary | ICD-10-CM | POA: Insufficient documentation

## 2024-04-14 DIAGNOSIS — R1084 Generalized abdominal pain: Secondary | ICD-10-CM

## 2024-04-14 DIAGNOSIS — Z7982 Long term (current) use of aspirin: Secondary | ICD-10-CM | POA: Insufficient documentation

## 2024-04-14 DIAGNOSIS — R1030 Lower abdominal pain, unspecified: Secondary | ICD-10-CM | POA: Diagnosis present

## 2024-04-14 DIAGNOSIS — Z794 Long term (current) use of insulin: Secondary | ICD-10-CM | POA: Diagnosis not present

## 2024-04-14 LAB — COMPREHENSIVE METABOLIC PANEL WITH GFR
ALT: 12 U/L (ref 0–44)
AST: 12 U/L — ABNORMAL LOW (ref 15–41)
Albumin: 3.1 g/dL — ABNORMAL LOW (ref 3.5–5.0)
Alkaline Phosphatase: 95 U/L (ref 38–126)
Anion gap: 6 (ref 5–15)
BUN: 9 mg/dL (ref 6–20)
CO2: 30 mmol/L (ref 22–32)
Calcium: 8.8 mg/dL — ABNORMAL LOW (ref 8.9–10.3)
Chloride: 100 mmol/L (ref 98–111)
Creatinine, Ser: 0.61 mg/dL (ref 0.44–1.00)
GFR, Estimated: >60 mL/min (ref >60)
Glucose, Bld: 101 mg/dL — ABNORMAL HIGH (ref 70–99)
Potassium: 3.4 mmol/L — ABNORMAL LOW (ref 3.5–5.1)
Sodium: 136 mmol/L (ref 135–145)
Total Bilirubin: 0.5 mg/dL (ref 0.0–1.2)
Total Protein: 6.8 g/dL (ref 6.5–8.1)

## 2024-04-14 LAB — CBC
HCT: 37 % (ref 36.0–46.0)
Hemoglobin: 12.8 g/dL (ref 12.0–15.0)
MCH: 29 pg (ref 26.0–34.0)
MCHC: 34.6 g/dL (ref 30.0–36.0)
MCV: 83.7 fL (ref 80.0–100.0)
Platelets: 329 10*3/uL (ref 150–400)
RBC: 4.42 MIL/uL (ref 3.87–5.11)
RDW: 12.6 % (ref 11.5–15.5)
WBC: 11.3 10*3/uL — ABNORMAL HIGH (ref 4.0–10.5)
nRBC: 0 % (ref 0.0–0.2)

## 2024-04-14 LAB — URINALYSIS, ROUTINE W REFLEX MICROSCOPIC
Bilirubin Urine: NEGATIVE
Glucose, UA: NEGATIVE mg/dL
Hgb urine dipstick: NEGATIVE
Ketones, ur: NEGATIVE mg/dL
Leukocytes,Ua: NEGATIVE
Nitrite: NEGATIVE
Protein, ur: NEGATIVE mg/dL
Specific Gravity, Urine: 1.012 (ref 1.005–1.030)
pH: 5 (ref 5.0–8.0)

## 2024-04-14 LAB — LIPASE, BLOOD: Lipase: 32 U/L (ref 11–51)

## 2024-04-14 MED ORDER — KETOROLAC TROMETHAMINE 15 MG/ML IJ SOLN
15.0000 mg | Freq: Once | INTRAMUSCULAR | Status: AC
  Administered 2024-04-14: 15 mg via INTRAVENOUS
  Filled 2024-04-14: qty 1

## 2024-04-14 MED ORDER — PANTOPRAZOLE SODIUM 40 MG PO TBEC: 40.0000 mg | DELAYED_RELEASE_TABLET | Freq: Every day | ORAL | 0 refills | Status: AC

## 2024-04-14 MED ORDER — DICYCLOMINE HCL 10 MG PO CAPS
10.0000 mg | ORAL_CAPSULE | Freq: Once | ORAL | Status: AC
  Administered 2024-04-14: 10 mg via ORAL
  Filled 2024-04-14: qty 1

## 2024-04-14 MED ORDER — SODIUM CHLORIDE 0.9 % IV BOLUS
1000.0000 mL | Freq: Once | INTRAVENOUS | Status: AC
  Administered 2024-04-14: 1000 mL via INTRAVENOUS

## 2024-04-14 MED ORDER — IOHEXOL 300 MG/ML  SOLN
100.0000 mL | Freq: Once | INTRAMUSCULAR | Status: AC | PRN
  Administered 2024-04-14: 100 mL via INTRAVENOUS

## 2024-04-14 MED ORDER — POTASSIUM CHLORIDE CRYS ER 20 MEQ PO TBCR
40.0000 meq | EXTENDED_RELEASE_TABLET | Freq: Once | ORAL | Status: AC
  Administered 2024-04-14: 40 meq via ORAL
  Filled 2024-04-14: qty 2

## 2024-04-14 MED ORDER — DICYCLOMINE HCL 20 MG PO TABS: 20.0000 mg | ORAL_TABLET | Freq: Two times a day (BID) | ORAL | 0 refills | Status: AC

## 2024-04-14 NOTE — ED Triage Notes (Signed)
 Pt c/o lower abdominal pain that started last night; pt denies any n/v/d and states her last BM was yesterday and it was normal

## 2024-04-14 NOTE — Discharge Instructions (Signed)
 It was a pleasure caring for you today in the emergency department.  Please see your pcp for recheck. If symptoms persist please follow up with gastroenterology. If symptoms worsen come back to the ER  There are many causes of abdominal pain. Most pain is not serious and goes away, but some pain gets worse, changes, or will not go away. Please return to the emergency department or see your doctor right away if you (or your family member) experience any of the following:  1. Pain that gets worse or moves to just one spot.  2. Pain that gets worse if you cough or sneeze.  3. Pain with going over a bump in the road.  4. Pain that does not get better in 24 hours.  5. Inability to keep down liquids (vomiting)-especially if you are making less urine.  6. Fainting.  7. Blood in the vomit or stool.  8. High fever or shaking chills.  9. Swelling of the abdomen.  10. Any new or worsening problem.     Additional Instructions  No alcohol.  No caffeine, aspirin , or cigarettes.   Please return to the emergency department immediately for any new or concerning symptoms, or if you get worse.

## 2024-04-14 NOTE — ED Provider Notes (Signed)
 [facility] DEPARTMENT AT [facility] Provider Note  CSN: [id number] Arrival date & time: 04/14/24 8295  Chief Complaint(s) Abdominal Pain  HPI OLIVIYA GILKISON is a 58 y.o. female with past medical history as below, significant for DM, HTN who presents to the ED with complaint of abd pain  Pain has been intermittent over last few mos but worse last night, cramping/squeezing sensation to LLQ and suprapubic area, also RLQ. Pain has since improved but is still lingering. No n/v, no change to bowel/bladder fxn, no fever or chills. Prior colonscopy <10 yrs ago that was WNL per pt  Past Medical History Past Medical History:  Diagnosis Date   Diabetes mellitus    Hypertension    Patient Active Problem List   Diagnosis Date Noted   Cardiac contusion 07/18/2018   Home Medication(s) Prior to Admission medications   Medication Sig Start Date End Date Taking? Authorizing Provider  acyclovir  (ZOVIRAX ) 400 MG tablet Take 2 tablets (800 mg total) by mouth 5 (five) times daily. 10/28/20   Early Glisson, MD  AMLODIPINE BESYLATE PO Take 1 tablet by mouth daily.    [provider]  aspirin  EC 81 MG tablet Take 81 mg by mouth daily.    [provider]  famotidine  (PEPCID ) 20 MG tablet Take 1 tablet (20 mg total) by mouth daily for 20 days. 07/18/18 08/07/18  Arrien, Mauricio Daniel, MD  gabapentin  (NEURONTIN ) 300 MG capsule Take 300 mg by mouth 3 (three) times daily.    [provider]  hydrochlorothiazide  (HYDRODIURIL ) 25 MG tablet Take 25 mg by mouth daily.    [provider]  ibuprofen  (ADVIL ,MOTRIN ) 400 MG tablet Take 1 tablet (400 mg total) by mouth every 6 (six) hours as needed (chest pain.). 07/18/18   Arrien, Curlee Doss, MD  Insulin  Detemir (LEVEMIR  FLEXTOUCH) 100 UNIT/ML Pen Inject 55 Units into the skin at bedtime.    [provider]  lisinopril  (PRINIVIL ,ZESTRIL ) 20 MG tablet Take 20 mg daily by mouth.     [provider]  NOVOLOG  100 UNIT/ML injection Inject 10-25 Units into the skin 3 (three) times daily. Based on sliding scale 06/27/18   [provider]  Red Yeast Rice Extract (RED YEAST RICE PO) Take 1 capsule by mouth daily.    [provider]  sitaGLIPtin (JANUVIA) 100 MG tablet Take 100 mg by mouth daily.    [provider]                                                                                                                                    Past Surgical History Past Surgical History:  Procedure Laterality Date   CESAREAN SECTION     Family History History reviewed. No pertinent family history.  Social History Social History   Tobacco Use   Smoking status: Never   Smokeless tobacco: Never  Substance Use Topics   Alcohol use:  No   Drug use: No   Allergies Patient has no known allergies.  Review of Systems A thorough review of systems was obtained and all systems are negative except as noted in the HPI and PMH.   Physical Exam Vital Signs  I have reviewed the triage vital signs BP 138/77   Pulse 68   Temp 98.2 F (36.8 C) (Oral)   Resp 16   LMP 07/06/2018   SpO2 99%  Physical Exam Vitals and nursing note reviewed.  Constitutional:      General: She is not in acute distress.    Appearance: Normal appearance. She is well-developed. She is not ill-appearing.  HENT:     Head: Normocephalic and atraumatic.     Right Ear: External ear normal.     Left Ear: External ear normal.     Nose: Nose normal.     Mouth/Throat:     Mouth: Mucous membranes are moist.  Eyes:     General: No scleral icterus.       Right eye: No discharge.        Left eye: No discharge.  Cardiovascular:     Rate and Rhythm: Normal rate.  Pulmonary:     Effort: Pulmonary effort is normal. No respiratory distress.     Breath sounds: No stridor.  Abdominal:     General: Abdomen is flat. There is no distension.     Tenderness: There is abdominal tenderness in the right  lower quadrant, suprapubic area and left lower quadrant. There is no guarding.     Comments: Very mild ttp lower quads  Musculoskeletal:        General: No deformity.     Cervical back: No rigidity.  Skin:    General: Skin is warm and dry.     Coloration: Skin is not cyanotic, jaundiced or pale.  Neurological:     Mental Status: She is alert and oriented to person, place, and time.     GCS: GCS eye subscore is 4. GCS verbal subscore is 5. GCS motor subscore is 6.  Psychiatric:        Speech: Speech normal.        Behavior: Behavior normal. Behavior is cooperative.     ED Results and Treatments Labs (all labs ordered are listed, but only abnormal results are displayed) Labs Reviewed  COMPREHENSIVE METABOLIC PANEL WITH GFR - Abnormal; Notable for the following components:      Result Value   Potassium 3.4 (*)    Glucose, Bld 101 (*)    Calcium 8.8 (*)    Albumin 3.1 (*)    AST 12 (*)    All other components within normal limits  CBC - Abnormal; Notable for the following components:   WBC 11.3 (*)    All other components within normal limits  LIPASE, BLOOD  URINALYSIS, ROUTINE W REFLEX MICROSCOPIC  Radiology CT ABDOMEN PELVIS W CONTRAST Result Date: 04/14/2024 CLINICAL DATA:  LLQ abdominal pain, nausea, vomiting, and diarrhea EXAM: CT ABDOMEN AND PELVIS WITH CONTRAST TECHNIQUE: Multidetector CT imaging of the abdomen and pelvis was performed using the standard protocol following bolus administration of intravenous contrast. RADIATION DOSE REDUCTION: This exam was performed according to the departmental dose-optimization program which includes automated exposure control, adjustment of the mA and/or kV according to patient size and/or use of iterative reconstruction technique. CONTRAST:  OMNIPAQUE  IOHEXOL  300 MG/ML  SOLN COMPARISON:  Apr 06, 2024 FINDINGS:  Lower chest: No focal airspace consolidation or pleural effusion.Posterior bibasilar dependent atelectasis. Hepatobiliary: No mass. No radiopaque stones or wall thickening of the gallbladder. No intrahepatic or extrahepatic biliary ductal dilation. The portal veins are patent. Pancreas: No mass or main ductal dilation. No peripancreatic inflammation or fluid collection. Spleen: Normal size. No mass. Adrenals/Urinary Tract: Slightly larger, left adrenal nodule measuring 2.1 cm (previously 1.7 cm), consistent with a benign adenoma. No renal mass. No nephrolithiasis or hydronephrosis. The urinary bladder is distended without focal abnormality. Stomach/Bowel: The stomach is decompressed without focal abnormality. No small bowel wall thickening or inflammation. No small bowel obstruction.Normal appendix. Vascular/Lymphatic: No aortic aneurysm. No intraabdominal or pelvic lymphadenopathy. Reproductive: Age-related atrophy of the uterus and ovaries. No concerning adnexal mass.No free pelvic fluid. Other: No pneumoperitoneum, ascites, or mesenteric inflammation. Musculoskeletal: No acute fracture or destructive lesion. Thoracic DISH. IMPRESSION: No acute intraabdominal or pelvic abnormality. Electronically Signed   By: Rance Burrows M.D.   On: 04/14/2024 10:43    Pertinent labs & imaging results that were available during my care of the patient were reviewed by me and considered in my medical decision making (see MDM for details).  Medications Ordered in ED Medications  dicyclomine (BENTYL) capsule 10 mg (10 mg Oral Given 04/14/24 0824)  ketorolac (TORADOL) 15 MG/ML injection 15 mg (15 mg Intravenous Given 04/14/24 0826)  potassium chloride  SA (KLOR-CON  M) CR tablet 40 mEq (40 mEq Oral Given 04/14/24 0823)  sodium chloride  0.9 % bolus 1,000 mL (0 mLs Intravenous Stopped 04/14/24 1144)  iohexol  (OMNIPAQUE ) 300 MG/ML solution 100 mL (100 mLs Intravenous Contrast Given 04/14/24 0851)                                                                                                                                      Procedures Procedures  (including critical care time)  Medical Decision Making / ED Course    Medical Decision Making:    YUNA PIZZOLATO is a 58 y.o. female  with past medical history as below, significant for DM, HTN who presents to the ED with complaint of abd pain. The complaint involves an extensive differential diagnosis and also carries with it a high risk of complications and morbidity.  Serious etiology was considered. Ddx includes but is not limited to: Differential diagnosis includes but is not exclusive to ovarian cyst,  ovarian torsion, acute appendicitis, urinary tract infection, endometriosis, bowel obstruction, hernia, colitis, renal colic, gastroenteritis, volvulus etc.   Complete initial physical exam performed, notably the patient was in no distress, sitting comfortably.    Reviewed and confirmed nursing documentation for past medical history, family history, social history.  Vital signs reviewed.     Brief summary:  58 yo/f w/ hx as above here with lower abd pain since last night Exam is stable, not peritoneal    Clinical Course as of 04/14/24 1308  Wed Apr 14, 2024  1207 Feeling better [SG]  1308 Potassium(!): 3.4 replaced [SG]    Clinical Course User Index [SG] Teddi Favors, DO     Labs and imaging are stable.  She is feeling better, she is tolerant p.o. intake. Unclear etiology of her symptoms today. Give abdominal pain precautions. Bland diet, medications for home.  Follow-up PCP  The patient's overall condition has improved, the patient presents with abdominal pain without signs of peritonitis, or other life-threatening serious etiology. The patient understands that at this time there is no evidence for a more malignant underlying process, but the patient also understands that early in the process of an illness, an emergency department workup can be  falsely reassuring. Detailed discussions were had with the patient regarding current findings, and need for close f/u with PCP or on call doctor. The patient appears stable for discharge and has been instructed to return immediately if the symptoms worsen in any way, or in 12-24hr if not improved for re-evaluation. Patient verbalized understanding and is in agreement with current care plan.  All questions answered prior to discharge.             Additional history obtained: -Additional history obtained from na -External records from outside source obtained and reviewed including: Chart review including previous notes, labs, imaging, consultation notes including  Primary care documentation Home meds Prior labs   Lab Tests: -I ordered, reviewed, and interpreted labs.   The pertinent results include:   Labs Reviewed  COMPREHENSIVE METABOLIC PANEL WITH GFR - Abnormal; Notable for the following components:      Result Value   Potassium 3.4 (*)    Glucose, Bld 101 (*)    Calcium 8.8 (*)    Albumin 3.1 (*)    AST 12 (*)    All other components within normal limits  CBC - Abnormal; Notable for the following components:   WBC 11.3 (*)    All other components within normal limits  LIPASE, BLOOD  URINALYSIS, ROUTINE W REFLEX MICROSCOPIC    Notable for labs stable  EKG   EKG Interpretation Date/Time:    Ventricular Rate:    PR Interval:    QRS Duration:    QT Interval:    QTC Calculation:   R Axis:      Text Interpretation:           Imaging Studies ordered: I ordered imaging studies including CTA/P I independently visualized the following imaging with scope of interpretation limited to determining acute life threatening conditions related to emergency care; findings noted above I agree with the radiologist interpretation If any imaging was obtained with contrast I closely monitored patient for any possible adverse reaction a/w contrast administration in the  emergency department   Medicines ordered and prescription drug management: Meds ordered this encounter  Medications   dicyclomine (BENTYL) capsule 10 mg   ketorolac (TORADOL) 15 MG/ML injection 15 mg   potassium chloride  SA (KLOR-CON  M) CR  tablet 40 mEq   sodium chloride  0.9 % bolus 1,000 mL   iohexol  (OMNIPAQUE ) 300 MG/ML solution 100 mL    -I have reviewed the patients home medicines and have made adjustments as needed   Consultations Obtained: na   Cardiac Monitoring: Continuous pulse oximetry interpreted by myself, 100% on RA.    Social Determinants of Health:  Diagnosis or treatment significantly limited by social determinants of health: obesity   Reevaluation: After the interventions noted above, I reevaluated the patient and found that they have improved  Co morbidities that complicate the patient evaluation  Past Medical History:  Diagnosis Date   Diabetes mellitus    Hypertension       Dispostion: Disposition decision including need for hospitalization was considered, and patient discharged from emergency department.    Final Clinical Impression(s) / ED Diagnoses Final diagnoses:  Generalized abdominal pain        Teddi Favors, DO 04/14/24 1308
